# Patient Record
Sex: Female | Born: 1975
Health system: Southern US, Community
[De-identification: ages and names within clinical notes are randomized; demographics above are authoritative.]

## PROBLEM LIST (undated history)

## (undated) DIAGNOSIS — F419 Anxiety disorder, unspecified: Secondary | ICD-10-CM

## (undated) DIAGNOSIS — K219 Gastro-esophageal reflux disease without esophagitis: Secondary | ICD-10-CM

## (undated) DIAGNOSIS — Z789 Other specified health status: Secondary | ICD-10-CM

## (undated) DIAGNOSIS — Q909 Down syndrome, unspecified: Secondary | ICD-10-CM

## (undated) HISTORY — DX: Other specified health status: Z78.9

## (undated) HISTORY — PX: MOUTH SURGERY: SHX715

---

## 2014-07-15 DIAGNOSIS — R52 Pain, unspecified: Secondary | ICD-10-CM | POA: Diagnosis not present

## 2014-07-15 DIAGNOSIS — E039 Hypothyroidism, unspecified: Secondary | ICD-10-CM | POA: Diagnosis not present

## 2014-07-15 DIAGNOSIS — Z3041 Encounter for surveillance of contraceptive pills: Secondary | ICD-10-CM | POA: Diagnosis not present

## 2014-09-13 DIAGNOSIS — F419 Anxiety disorder, unspecified: Secondary | ICD-10-CM | POA: Diagnosis not present

## 2014-09-13 DIAGNOSIS — J329 Chronic sinusitis, unspecified: Secondary | ICD-10-CM | POA: Diagnosis not present

## 2014-09-13 DIAGNOSIS — E669 Obesity, unspecified: Secondary | ICD-10-CM | POA: Diagnosis not present

## 2014-09-13 DIAGNOSIS — Q909 Down syndrome, unspecified: Secondary | ICD-10-CM | POA: Diagnosis not present

## 2014-11-14 DIAGNOSIS — F419 Anxiety disorder, unspecified: Secondary | ICD-10-CM | POA: Diagnosis not present

## 2014-11-14 DIAGNOSIS — J329 Chronic sinusitis, unspecified: Secondary | ICD-10-CM | POA: Diagnosis not present

## 2014-11-14 DIAGNOSIS — E669 Obesity, unspecified: Secondary | ICD-10-CM | POA: Diagnosis not present

## 2014-11-14 DIAGNOSIS — Q909 Down syndrome, unspecified: Secondary | ICD-10-CM | POA: Diagnosis not present

## 2014-11-14 DIAGNOSIS — E039 Hypothyroidism, unspecified: Secondary | ICD-10-CM | POA: Diagnosis not present

## 2014-11-20 DIAGNOSIS — I10 Essential (primary) hypertension: Secondary | ICD-10-CM | POA: Diagnosis not present

## 2014-11-20 DIAGNOSIS — E039 Hypothyroidism, unspecified: Secondary | ICD-10-CM | POA: Diagnosis not present

## 2014-11-20 DIAGNOSIS — Z139 Encounter for screening, unspecified: Secondary | ICD-10-CM | POA: Diagnosis not present

## 2014-11-20 DIAGNOSIS — E780 Pure hypercholesterolemia: Secondary | ICD-10-CM | POA: Diagnosis not present

## 2015-02-11 DIAGNOSIS — B9689 Other specified bacterial agents as the cause of diseases classified elsewhere: Secondary | ICD-10-CM | POA: Diagnosis not present

## 2015-02-11 DIAGNOSIS — Z01419 Encounter for gynecological examination (general) (routine) without abnormal findings: Secondary | ICD-10-CM | POA: Diagnosis not present

## 2015-09-16 DIAGNOSIS — E119 Type 2 diabetes mellitus without complications: Secondary | ICD-10-CM | POA: Diagnosis not present

## 2015-09-16 DIAGNOSIS — I10 Essential (primary) hypertension: Secondary | ICD-10-CM | POA: Diagnosis not present

## 2015-09-16 DIAGNOSIS — J309 Allergic rhinitis, unspecified: Secondary | ICD-10-CM | POA: Diagnosis not present

## 2015-09-16 DIAGNOSIS — E7800 Pure hypercholesterolemia, unspecified: Secondary | ICD-10-CM | POA: Diagnosis not present

## 2015-09-16 DIAGNOSIS — Q909 Down syndrome, unspecified: Secondary | ICD-10-CM | POA: Diagnosis not present

## 2015-09-16 DIAGNOSIS — E039 Hypothyroidism, unspecified: Secondary | ICD-10-CM | POA: Diagnosis not present

## 2015-09-16 DIAGNOSIS — Z139 Encounter for screening, unspecified: Secondary | ICD-10-CM | POA: Diagnosis not present

## 2015-09-16 DIAGNOSIS — Z833 Family history of diabetes mellitus: Secondary | ICD-10-CM | POA: Diagnosis not present

## 2015-11-21 DIAGNOSIS — F4321 Adjustment disorder with depressed mood: Secondary | ICD-10-CM | POA: Diagnosis not present

## 2015-11-21 DIAGNOSIS — Q909 Down syndrome, unspecified: Secondary | ICD-10-CM | POA: Diagnosis not present

## 2015-11-21 DIAGNOSIS — R52 Pain, unspecified: Secondary | ICD-10-CM | POA: Diagnosis not present

## 2015-11-21 DIAGNOSIS — K219 Gastro-esophageal reflux disease without esophagitis: Secondary | ICD-10-CM | POA: Diagnosis not present

## 2015-11-21 DIAGNOSIS — E039 Hypothyroidism, unspecified: Secondary | ICD-10-CM | POA: Diagnosis not present

## 2015-12-08 DIAGNOSIS — Q909 Down syndrome, unspecified: Secondary | ICD-10-CM | POA: Diagnosis not present

## 2015-12-08 DIAGNOSIS — F900 Attention-deficit hyperactivity disorder, predominantly inattentive type: Secondary | ICD-10-CM | POA: Diagnosis not present

## 2015-12-23 DIAGNOSIS — Z3042 Encounter for surveillance of injectable contraceptive: Secondary | ICD-10-CM | POA: Diagnosis not present

## 2015-12-23 DIAGNOSIS — E039 Hypothyroidism, unspecified: Secondary | ICD-10-CM | POA: Diagnosis not present

## 2015-12-26 DIAGNOSIS — M545 Low back pain: Secondary | ICD-10-CM | POA: Diagnosis not present

## 2015-12-26 DIAGNOSIS — G2581 Restless legs syndrome: Secondary | ICD-10-CM | POA: Diagnosis not present

## 2015-12-26 DIAGNOSIS — F418 Other specified anxiety disorders: Secondary | ICD-10-CM | POA: Diagnosis not present

## 2015-12-26 DIAGNOSIS — R7303 Prediabetes: Secondary | ICD-10-CM | POA: Diagnosis not present

## 2015-12-26 DIAGNOSIS — Z1389 Encounter for screening for other disorder: Secondary | ICD-10-CM | POA: Diagnosis not present

## 2015-12-26 DIAGNOSIS — Z6841 Body Mass Index (BMI) 40.0 and over, adult: Secondary | ICD-10-CM | POA: Diagnosis not present

## 2015-12-26 DIAGNOSIS — E039 Hypothyroidism, unspecified: Secondary | ICD-10-CM | POA: Diagnosis not present

## 2016-03-02 DIAGNOSIS — F418 Other specified anxiety disorders: Secondary | ICD-10-CM | POA: Diagnosis not present

## 2016-03-02 DIAGNOSIS — K219 Gastro-esophageal reflux disease without esophagitis: Secondary | ICD-10-CM | POA: Diagnosis not present

## 2016-03-02 DIAGNOSIS — Z79899 Other long term (current) drug therapy: Secondary | ICD-10-CM | POA: Diagnosis not present

## 2016-03-02 DIAGNOSIS — E039 Hypothyroidism, unspecified: Secondary | ICD-10-CM | POA: Diagnosis not present

## 2016-03-02 DIAGNOSIS — G2581 Restless legs syndrome: Secondary | ICD-10-CM | POA: Diagnosis not present

## 2016-03-02 DIAGNOSIS — M545 Low back pain: Secondary | ICD-10-CM | POA: Diagnosis not present

## 2016-03-02 DIAGNOSIS — Z23 Encounter for immunization: Secondary | ICD-10-CM | POA: Diagnosis not present

## 2016-09-08 DIAGNOSIS — E039 Hypothyroidism, unspecified: Secondary | ICD-10-CM | POA: Diagnosis not present

## 2016-09-08 DIAGNOSIS — Z79899 Other long term (current) drug therapy: Secondary | ICD-10-CM | POA: Diagnosis not present

## 2016-09-08 DIAGNOSIS — Z0000 Encounter for general adult medical examination without abnormal findings: Secondary | ICD-10-CM | POA: Diagnosis not present

## 2016-09-08 DIAGNOSIS — Z136 Encounter for screening for cardiovascular disorders: Secondary | ICD-10-CM | POA: Diagnosis not present

## 2016-09-08 DIAGNOSIS — M545 Low back pain: Secondary | ICD-10-CM | POA: Diagnosis not present

## 2016-09-08 DIAGNOSIS — Z3009 Encounter for other general counseling and advice on contraception: Secondary | ICD-10-CM | POA: Diagnosis not present

## 2016-09-08 DIAGNOSIS — R7303 Prediabetes: Secondary | ICD-10-CM | POA: Diagnosis not present

## 2016-09-08 DIAGNOSIS — Q909 Down syndrome, unspecified: Secondary | ICD-10-CM | POA: Diagnosis not present

## 2016-09-08 DIAGNOSIS — F418 Other specified anxiety disorders: Secondary | ICD-10-CM | POA: Diagnosis not present

## 2016-09-08 DIAGNOSIS — Z6841 Body Mass Index (BMI) 40.0 and over, adult: Secondary | ICD-10-CM | POA: Diagnosis not present

## 2016-09-08 DIAGNOSIS — G2581 Restless legs syndrome: Secondary | ICD-10-CM | POA: Diagnosis not present

## 2016-09-08 DIAGNOSIS — Z309 Encounter for contraceptive management, unspecified: Secondary | ICD-10-CM | POA: Diagnosis not present

## 2016-09-13 DIAGNOSIS — Q909 Down syndrome, unspecified: Secondary | ICD-10-CM | POA: Diagnosis not present

## 2016-09-13 DIAGNOSIS — G2581 Restless legs syndrome: Secondary | ICD-10-CM | POA: Diagnosis not present

## 2016-09-13 DIAGNOSIS — F418 Other specified anxiety disorders: Secondary | ICD-10-CM | POA: Diagnosis not present

## 2016-09-13 DIAGNOSIS — E039 Hypothyroidism, unspecified: Secondary | ICD-10-CM | POA: Diagnosis not present

## 2016-09-13 DIAGNOSIS — Z6841 Body Mass Index (BMI) 40.0 and over, adult: Secondary | ICD-10-CM | POA: Diagnosis not present

## 2016-09-13 DIAGNOSIS — F70 Mild intellectual disabilities: Secondary | ICD-10-CM | POA: Diagnosis not present

## 2016-09-13 DIAGNOSIS — M545 Low back pain: Secondary | ICD-10-CM | POA: Diagnosis not present

## 2016-09-13 DIAGNOSIS — R7303 Prediabetes: Secondary | ICD-10-CM | POA: Diagnosis not present

## 2016-09-13 DIAGNOSIS — M1990 Unspecified osteoarthritis, unspecified site: Secondary | ICD-10-CM | POA: Diagnosis not present

## 2016-09-16 DIAGNOSIS — Q909 Down syndrome, unspecified: Secondary | ICD-10-CM | POA: Diagnosis not present

## 2016-09-16 DIAGNOSIS — M545 Low back pain: Secondary | ICD-10-CM | POA: Diagnosis not present

## 2016-09-16 DIAGNOSIS — F70 Mild intellectual disabilities: Secondary | ICD-10-CM | POA: Diagnosis not present

## 2016-09-16 DIAGNOSIS — F418 Other specified anxiety disorders: Secondary | ICD-10-CM | POA: Diagnosis not present

## 2016-09-16 DIAGNOSIS — M1990 Unspecified osteoarthritis, unspecified site: Secondary | ICD-10-CM | POA: Diagnosis not present

## 2016-09-23 DIAGNOSIS — M545 Low back pain: Secondary | ICD-10-CM | POA: Diagnosis not present

## 2016-09-23 DIAGNOSIS — F418 Other specified anxiety disorders: Secondary | ICD-10-CM | POA: Diagnosis not present

## 2016-09-23 DIAGNOSIS — F70 Mild intellectual disabilities: Secondary | ICD-10-CM | POA: Diagnosis not present

## 2016-09-23 DIAGNOSIS — Q909 Down syndrome, unspecified: Secondary | ICD-10-CM | POA: Diagnosis not present

## 2016-09-23 DIAGNOSIS — M1990 Unspecified osteoarthritis, unspecified site: Secondary | ICD-10-CM | POA: Diagnosis not present

## 2016-09-28 DIAGNOSIS — M545 Low back pain: Secondary | ICD-10-CM | POA: Diagnosis not present

## 2016-09-28 DIAGNOSIS — F70 Mild intellectual disabilities: Secondary | ICD-10-CM | POA: Diagnosis not present

## 2016-09-28 DIAGNOSIS — Q909 Down syndrome, unspecified: Secondary | ICD-10-CM | POA: Diagnosis not present

## 2016-09-28 DIAGNOSIS — M1990 Unspecified osteoarthritis, unspecified site: Secondary | ICD-10-CM | POA: Diagnosis not present

## 2016-09-28 DIAGNOSIS — F418 Other specified anxiety disorders: Secondary | ICD-10-CM | POA: Diagnosis not present

## 2016-09-30 DIAGNOSIS — M545 Low back pain: Secondary | ICD-10-CM | POA: Diagnosis not present

## 2016-09-30 DIAGNOSIS — F418 Other specified anxiety disorders: Secondary | ICD-10-CM | POA: Diagnosis not present

## 2016-09-30 DIAGNOSIS — M1990 Unspecified osteoarthritis, unspecified site: Secondary | ICD-10-CM | POA: Diagnosis not present

## 2016-09-30 DIAGNOSIS — Q909 Down syndrome, unspecified: Secondary | ICD-10-CM | POA: Diagnosis not present

## 2016-09-30 DIAGNOSIS — F70 Mild intellectual disabilities: Secondary | ICD-10-CM | POA: Diagnosis not present

## 2016-10-05 DIAGNOSIS — F70 Mild intellectual disabilities: Secondary | ICD-10-CM | POA: Diagnosis not present

## 2016-10-05 DIAGNOSIS — Q909 Down syndrome, unspecified: Secondary | ICD-10-CM | POA: Diagnosis not present

## 2016-10-05 DIAGNOSIS — M545 Low back pain: Secondary | ICD-10-CM | POA: Diagnosis not present

## 2016-10-05 DIAGNOSIS — M1990 Unspecified osteoarthritis, unspecified site: Secondary | ICD-10-CM | POA: Diagnosis not present

## 2016-10-05 DIAGNOSIS — F418 Other specified anxiety disorders: Secondary | ICD-10-CM | POA: Diagnosis not present

## 2016-10-07 DIAGNOSIS — M545 Low back pain: Secondary | ICD-10-CM | POA: Diagnosis not present

## 2016-10-07 DIAGNOSIS — F70 Mild intellectual disabilities: Secondary | ICD-10-CM | POA: Diagnosis not present

## 2016-10-07 DIAGNOSIS — F418 Other specified anxiety disorders: Secondary | ICD-10-CM | POA: Diagnosis not present

## 2016-10-07 DIAGNOSIS — Q909 Down syndrome, unspecified: Secondary | ICD-10-CM | POA: Diagnosis not present

## 2016-10-07 DIAGNOSIS — M1990 Unspecified osteoarthritis, unspecified site: Secondary | ICD-10-CM | POA: Diagnosis not present

## 2016-10-13 DIAGNOSIS — F70 Mild intellectual disabilities: Secondary | ICD-10-CM | POA: Diagnosis not present

## 2016-10-13 DIAGNOSIS — M545 Low back pain: Secondary | ICD-10-CM | POA: Diagnosis not present

## 2016-10-13 DIAGNOSIS — Q909 Down syndrome, unspecified: Secondary | ICD-10-CM | POA: Diagnosis not present

## 2016-10-13 DIAGNOSIS — M1990 Unspecified osteoarthritis, unspecified site: Secondary | ICD-10-CM | POA: Diagnosis not present

## 2016-10-13 DIAGNOSIS — F418 Other specified anxiety disorders: Secondary | ICD-10-CM | POA: Diagnosis not present

## 2016-10-21 DIAGNOSIS — F418 Other specified anxiety disorders: Secondary | ICD-10-CM | POA: Diagnosis not present

## 2016-10-21 DIAGNOSIS — M1990 Unspecified osteoarthritis, unspecified site: Secondary | ICD-10-CM | POA: Diagnosis not present

## 2016-10-21 DIAGNOSIS — M545 Low back pain: Secondary | ICD-10-CM | POA: Diagnosis not present

## 2016-10-21 DIAGNOSIS — F70 Mild intellectual disabilities: Secondary | ICD-10-CM | POA: Diagnosis not present

## 2016-10-21 DIAGNOSIS — Q909 Down syndrome, unspecified: Secondary | ICD-10-CM | POA: Diagnosis not present

## 2016-10-26 DIAGNOSIS — M545 Low back pain: Secondary | ICD-10-CM | POA: Diagnosis not present

## 2016-10-26 DIAGNOSIS — F70 Mild intellectual disabilities: Secondary | ICD-10-CM | POA: Diagnosis not present

## 2016-10-26 DIAGNOSIS — Q909 Down syndrome, unspecified: Secondary | ICD-10-CM | POA: Diagnosis not present

## 2016-10-26 DIAGNOSIS — F418 Other specified anxiety disorders: Secondary | ICD-10-CM | POA: Diagnosis not present

## 2016-10-26 DIAGNOSIS — M1990 Unspecified osteoarthritis, unspecified site: Secondary | ICD-10-CM | POA: Diagnosis not present

## 2016-11-11 DIAGNOSIS — F418 Other specified anxiety disorders: Secondary | ICD-10-CM | POA: Diagnosis not present

## 2016-11-11 DIAGNOSIS — M1990 Unspecified osteoarthritis, unspecified site: Secondary | ICD-10-CM | POA: Diagnosis not present

## 2016-11-11 DIAGNOSIS — M545 Low back pain: Secondary | ICD-10-CM | POA: Diagnosis not present

## 2016-11-11 DIAGNOSIS — Q909 Down syndrome, unspecified: Secondary | ICD-10-CM | POA: Diagnosis not present

## 2016-11-11 DIAGNOSIS — F70 Mild intellectual disabilities: Secondary | ICD-10-CM | POA: Diagnosis not present

## 2017-01-13 DIAGNOSIS — Z309 Encounter for contraceptive management, unspecified: Secondary | ICD-10-CM | POA: Diagnosis not present

## 2017-01-13 DIAGNOSIS — Z3009 Encounter for other general counseling and advice on contraception: Secondary | ICD-10-CM | POA: Diagnosis not present

## 2017-03-10 DIAGNOSIS — Z6841 Body Mass Index (BMI) 40.0 and over, adult: Secondary | ICD-10-CM | POA: Diagnosis not present

## 2017-03-10 DIAGNOSIS — Z1389 Encounter for screening for other disorder: Secondary | ICD-10-CM | POA: Diagnosis not present

## 2017-03-10 DIAGNOSIS — Q909 Down syndrome, unspecified: Secondary | ICD-10-CM | POA: Diagnosis not present

## 2017-03-10 DIAGNOSIS — M545 Low back pain: Secondary | ICD-10-CM | POA: Diagnosis not present

## 2017-03-10 DIAGNOSIS — J309 Allergic rhinitis, unspecified: Secondary | ICD-10-CM | POA: Diagnosis not present

## 2017-03-10 DIAGNOSIS — K219 Gastro-esophageal reflux disease without esophagitis: Secondary | ICD-10-CM | POA: Diagnosis not present

## 2017-03-10 DIAGNOSIS — F418 Other specified anxiety disorders: Secondary | ICD-10-CM | POA: Diagnosis not present

## 2017-03-10 DIAGNOSIS — Z79899 Other long term (current) drug therapy: Secondary | ICD-10-CM | POA: Diagnosis not present

## 2017-03-10 DIAGNOSIS — G2581 Restless legs syndrome: Secondary | ICD-10-CM | POA: Diagnosis not present

## 2017-03-10 DIAGNOSIS — E039 Hypothyroidism, unspecified: Secondary | ICD-10-CM | POA: Diagnosis not present

## 2017-03-10 DIAGNOSIS — R7303 Prediabetes: Secondary | ICD-10-CM | POA: Diagnosis not present

## 2017-03-10 DIAGNOSIS — Z23 Encounter for immunization: Secondary | ICD-10-CM | POA: Diagnosis not present

## 2017-04-06 DIAGNOSIS — Z3009 Encounter for other general counseling and advice on contraception: Secondary | ICD-10-CM | POA: Diagnosis not present

## 2017-04-06 DIAGNOSIS — Z309 Encounter for contraceptive management, unspecified: Secondary | ICD-10-CM | POA: Diagnosis not present

## 2017-06-28 DIAGNOSIS — Z3009 Encounter for other general counseling and advice on contraception: Secondary | ICD-10-CM | POA: Diagnosis not present

## 2017-06-28 DIAGNOSIS — Z309 Encounter for contraceptive management, unspecified: Secondary | ICD-10-CM | POA: Diagnosis not present

## 2017-08-26 DIAGNOSIS — Z79899 Other long term (current) drug therapy: Secondary | ICD-10-CM | POA: Diagnosis not present

## 2017-08-26 DIAGNOSIS — S32048A Other fracture of fourth lumbar vertebra, initial encounter for closed fracture: Secondary | ICD-10-CM | POA: Diagnosis not present

## 2017-08-26 DIAGNOSIS — S22089A Unspecified fracture of T11-T12 vertebra, initial encounter for closed fracture: Secondary | ICD-10-CM | POA: Diagnosis not present

## 2017-08-26 DIAGNOSIS — S32038A Other fracture of third lumbar vertebra, initial encounter for closed fracture: Secondary | ICD-10-CM | POA: Diagnosis not present

## 2017-08-26 DIAGNOSIS — R079 Chest pain, unspecified: Secondary | ICD-10-CM | POA: Diagnosis not present

## 2017-08-26 DIAGNOSIS — S22088A Other fracture of T11-T12 vertebra, initial encounter for closed fracture: Secondary | ICD-10-CM | POA: Diagnosis not present

## 2017-08-26 DIAGNOSIS — K219 Gastro-esophageal reflux disease without esophagitis: Secondary | ICD-10-CM | POA: Diagnosis not present

## 2017-08-26 DIAGNOSIS — F419 Anxiety disorder, unspecified: Secondary | ICD-10-CM | POA: Diagnosis not present

## 2017-08-26 DIAGNOSIS — M542 Cervicalgia: Secondary | ICD-10-CM | POA: Diagnosis not present

## 2017-08-26 DIAGNOSIS — G8911 Acute pain due to trauma: Secondary | ICD-10-CM | POA: Diagnosis not present

## 2017-08-26 DIAGNOSIS — S32028A Other fracture of second lumbar vertebra, initial encounter for closed fracture: Secondary | ICD-10-CM | POA: Diagnosis not present

## 2017-08-26 DIAGNOSIS — S32049A Unspecified fracture of fourth lumbar vertebra, initial encounter for closed fracture: Secondary | ICD-10-CM | POA: Diagnosis not present

## 2017-08-26 DIAGNOSIS — S32018A Other fracture of first lumbar vertebra, initial encounter for closed fracture: Secondary | ICD-10-CM | POA: Diagnosis not present

## 2017-08-27 DIAGNOSIS — Z041 Encounter for examination and observation following transport accident: Secondary | ICD-10-CM | POA: Diagnosis not present

## 2017-08-27 DIAGNOSIS — R079 Chest pain, unspecified: Secondary | ICD-10-CM | POA: Diagnosis not present

## 2017-09-06 DIAGNOSIS — M546 Pain in thoracic spine: Secondary | ICD-10-CM | POA: Diagnosis not present

## 2017-09-06 DIAGNOSIS — Z6841 Body Mass Index (BMI) 40.0 and over, adult: Secondary | ICD-10-CM | POA: Diagnosis not present

## 2017-09-06 DIAGNOSIS — M545 Low back pain: Secondary | ICD-10-CM | POA: Diagnosis not present

## 2017-09-06 DIAGNOSIS — T07XXXA Unspecified multiple injuries, initial encounter: Secondary | ICD-10-CM | POA: Diagnosis not present

## 2017-09-22 DIAGNOSIS — Z309 Encounter for contraceptive management, unspecified: Secondary | ICD-10-CM | POA: Diagnosis not present

## 2017-09-22 DIAGNOSIS — E785 Hyperlipidemia, unspecified: Secondary | ICD-10-CM | POA: Diagnosis not present

## 2017-09-22 DIAGNOSIS — E669 Obesity, unspecified: Secondary | ICD-10-CM | POA: Diagnosis not present

## 2017-09-22 DIAGNOSIS — Z136 Encounter for screening for cardiovascular disorders: Secondary | ICD-10-CM | POA: Diagnosis not present

## 2017-09-22 DIAGNOSIS — Z3009 Encounter for other general counseling and advice on contraception: Secondary | ICD-10-CM | POA: Diagnosis not present

## 2017-09-22 DIAGNOSIS — Z0000 Encounter for general adult medical examination without abnormal findings: Secondary | ICD-10-CM | POA: Diagnosis not present

## 2017-10-06 DIAGNOSIS — S32009A Unspecified fracture of unspecified lumbar vertebra, initial encounter for closed fracture: Secondary | ICD-10-CM | POA: Diagnosis not present

## 2017-10-06 DIAGNOSIS — Z6841 Body Mass Index (BMI) 40.0 and over, adult: Secondary | ICD-10-CM | POA: Diagnosis not present

## 2017-10-06 DIAGNOSIS — Z79899 Other long term (current) drug therapy: Secondary | ICD-10-CM | POA: Diagnosis not present

## 2017-10-06 DIAGNOSIS — S22009A Unspecified fracture of unspecified thoracic vertebra, initial encounter for closed fracture: Secondary | ICD-10-CM | POA: Diagnosis not present

## 2017-10-07 DIAGNOSIS — M5134 Other intervertebral disc degeneration, thoracic region: Secondary | ICD-10-CM | POA: Diagnosis not present

## 2017-10-07 DIAGNOSIS — S32009A Unspecified fracture of unspecified lumbar vertebra, initial encounter for closed fracture: Secondary | ICD-10-CM | POA: Diagnosis not present

## 2017-10-07 DIAGNOSIS — M5137 Other intervertebral disc degeneration, lumbosacral region: Secondary | ICD-10-CM | POA: Diagnosis not present

## 2017-10-07 DIAGNOSIS — M47817 Spondylosis without myelopathy or radiculopathy, lumbosacral region: Secondary | ICD-10-CM | POA: Diagnosis not present

## 2017-10-07 DIAGNOSIS — M4687 Other specified inflammatory spondylopathies, lumbosacral region: Secondary | ICD-10-CM | POA: Diagnosis not present

## 2017-11-07 DIAGNOSIS — E039 Hypothyroidism, unspecified: Secondary | ICD-10-CM | POA: Diagnosis not present

## 2017-11-07 DIAGNOSIS — S22009A Unspecified fracture of unspecified thoracic vertebra, initial encounter for closed fracture: Secondary | ICD-10-CM | POA: Diagnosis not present

## 2017-11-07 DIAGNOSIS — M545 Low back pain: Secondary | ICD-10-CM | POA: Diagnosis not present

## 2017-11-07 DIAGNOSIS — S32009A Unspecified fracture of unspecified lumbar vertebra, initial encounter for closed fracture: Secondary | ICD-10-CM | POA: Diagnosis not present

## 2017-12-08 DIAGNOSIS — Z309 Encounter for contraceptive management, unspecified: Secondary | ICD-10-CM | POA: Diagnosis not present

## 2017-12-08 DIAGNOSIS — Z3009 Encounter for other general counseling and advice on contraception: Secondary | ICD-10-CM | POA: Diagnosis not present

## 2018-03-17 DIAGNOSIS — Z309 Encounter for contraceptive management, unspecified: Secondary | ICD-10-CM | POA: Diagnosis not present

## 2018-06-28 DIAGNOSIS — Z309 Encounter for contraceptive management, unspecified: Secondary | ICD-10-CM | POA: Diagnosis not present

## 2018-06-28 DIAGNOSIS — M545 Low back pain: Secondary | ICD-10-CM | POA: Diagnosis not present

## 2018-06-28 DIAGNOSIS — Z3009 Encounter for other general counseling and advice on contraception: Secondary | ICD-10-CM | POA: Diagnosis not present

## 2018-06-28 DIAGNOSIS — G2581 Restless legs syndrome: Secondary | ICD-10-CM | POA: Diagnosis not present

## 2018-06-28 DIAGNOSIS — Q909 Down syndrome, unspecified: Secondary | ICD-10-CM | POA: Diagnosis not present

## 2018-06-28 DIAGNOSIS — F418 Other specified anxiety disorders: Secondary | ICD-10-CM | POA: Diagnosis not present

## 2018-09-25 DIAGNOSIS — Z1231 Encounter for screening mammogram for malignant neoplasm of breast: Secondary | ICD-10-CM | POA: Diagnosis not present

## 2018-09-25 DIAGNOSIS — Z6841 Body Mass Index (BMI) 40.0 and over, adult: Secondary | ICD-10-CM | POA: Diagnosis not present

## 2018-09-25 DIAGNOSIS — Z9181 History of falling: Secondary | ICD-10-CM | POA: Diagnosis not present

## 2018-09-25 DIAGNOSIS — Z1331 Encounter for screening for depression: Secondary | ICD-10-CM | POA: Diagnosis not present

## 2018-09-25 DIAGNOSIS — Z0000 Encounter for general adult medical examination without abnormal findings: Secondary | ICD-10-CM | POA: Diagnosis not present

## 2018-12-28 DIAGNOSIS — Z01419 Encounter for gynecological examination (general) (routine) without abnormal findings: Secondary | ICD-10-CM | POA: Diagnosis not present

## 2018-12-28 DIAGNOSIS — Z124 Encounter for screening for malignant neoplasm of cervix: Secondary | ICD-10-CM | POA: Diagnosis not present

## 2018-12-28 DIAGNOSIS — Z309 Encounter for contraceptive management, unspecified: Secondary | ICD-10-CM | POA: Diagnosis not present

## 2018-12-28 DIAGNOSIS — Z3009 Encounter for other general counseling and advice on contraception: Secondary | ICD-10-CM | POA: Diagnosis not present

## 2019-07-20 DIAGNOSIS — Z309 Encounter for contraceptive management, unspecified: Secondary | ICD-10-CM | POA: Diagnosis not present

## 2019-10-18 DIAGNOSIS — Z309 Encounter for contraceptive management, unspecified: Secondary | ICD-10-CM | POA: Diagnosis not present

## 2019-12-02 ENCOUNTER — Emergency Department (HOSPITAL_COMMUNITY)
Admission: EM | Admit: 2019-12-02 | Discharge: 2019-12-03 | Disposition: A | Discharge status: Home / Self Care | Payer: Medicare Other | Attending: Emergency Medicine | Admitting: Emergency Medicine

## 2019-12-02 ENCOUNTER — Other Ambulatory Visit: Payer: Self-pay

## 2019-12-02 ENCOUNTER — Emergency Department (HOSPITAL_COMMUNITY): Payer: Medicare Other

## 2019-12-02 DIAGNOSIS — R0689 Other abnormalities of breathing: Secondary | ICD-10-CM | POA: Diagnosis not present

## 2019-12-02 DIAGNOSIS — N939 Abnormal uterine and vaginal bleeding, unspecified: Secondary | ICD-10-CM | POA: Diagnosis not present

## 2019-12-02 DIAGNOSIS — R52 Pain, unspecified: Secondary | ICD-10-CM | POA: Diagnosis not present

## 2019-12-02 DIAGNOSIS — I959 Hypotension, unspecified: Secondary | ICD-10-CM | POA: Diagnosis not present

## 2019-12-02 DIAGNOSIS — R0602 Shortness of breath: Secondary | ICD-10-CM | POA: Diagnosis not present

## 2019-12-02 DIAGNOSIS — R102 Pelvic and perineal pain: Secondary | ICD-10-CM | POA: Insufficient documentation

## 2019-12-02 DIAGNOSIS — M5489 Other dorsalgia: Secondary | ICD-10-CM | POA: Diagnosis not present

## 2019-12-02 DIAGNOSIS — R000 Tachycardia, unspecified: Secondary | ICD-10-CM | POA: Insufficient documentation

## 2019-12-02 DIAGNOSIS — Z20822 Contact with and (suspected) exposure to covid-19: Secondary | ICD-10-CM | POA: Insufficient documentation

## 2019-12-02 DIAGNOSIS — D259 Leiomyoma of uterus, unspecified: Secondary | ICD-10-CM | POA: Diagnosis not present

## 2019-12-02 DIAGNOSIS — R1084 Generalized abdominal pain: Secondary | ICD-10-CM | POA: Diagnosis not present

## 2019-12-02 DIAGNOSIS — R58 Hemorrhage, not elsewhere classified: Secondary | ICD-10-CM | POA: Diagnosis not present

## 2019-12-02 LAB — COMPREHENSIVE METABOLIC PANEL
ALT: 16 U/L (ref 0–44)
AST: 16 U/L (ref 15–41)
Albumin: 3.8 g/dL (ref 3.5–5.0)
Alkaline Phosphatase: 46 U/L (ref 38–126)
Anion gap: 12 (ref 5–15)
BUN: 11 mg/dL (ref 6–20)
CO2: 19 mmol/L — ABNORMAL LOW (ref 22–32)
Calcium: 9.1 mg/dL (ref 8.9–10.3)
Chloride: 107 mmol/L (ref 98–111)
Creatinine, Ser: 1.05 mg/dL — ABNORMAL HIGH (ref 0.44–1.00)
GFR calc Af Amer: >60 mL/min (ref >60)
GFR calc non Af Amer: >60 mL/min (ref >60)
Glucose, Bld: 129 mg/dL — ABNORMAL HIGH (ref 70–99)
Potassium: 4.3 mmol/L (ref 3.5–5.1)
Sodium: 138 mmol/L (ref 135–145)
Total Bilirubin: 1 mg/dL (ref 0.3–1.2)
Total Protein: 6.5 g/dL (ref 6.5–8.1)

## 2019-12-02 LAB — CBC WITH DIFFERENTIAL/PLATELET
Abs Immature Granulocytes: 0.14 10*3/uL — ABNORMAL HIGH (ref 0.00–0.07)
Basophils Absolute: 0.1 10*3/uL (ref 0.0–0.1)
Basophils Relative: 0 %
Eosinophils Absolute: 0 10*3/uL (ref 0.0–0.5)
Eosinophils Relative: 0 %
HCT: 36.8 % (ref 36.0–46.0)
Hemoglobin: 11.9 g/dL — ABNORMAL LOW (ref 12.0–15.0)
Immature Granulocytes: 1 %
Lymphocytes Relative: 8 %
Lymphs Abs: 1.1 10*3/uL (ref 0.7–4.0)
MCH: 29.1 pg (ref 26.0–34.0)
MCHC: 32.3 g/dL (ref 30.0–36.0)
MCV: 90 fL (ref 80.0–100.0)
Monocytes Absolute: 0.6 10*3/uL (ref 0.1–1.0)
Monocytes Relative: 4 %
Neutro Abs: 11.6 10*3/uL — ABNORMAL HIGH (ref 1.7–7.7)
Neutrophils Relative %: 87 %
Platelets: 260 10*3/uL (ref 150–400)
RBC: 4.09 MIL/uL (ref 3.87–5.11)
RDW: 13.7 % (ref 11.5–15.5)
WBC: 13.5 10*3/uL — ABNORMAL HIGH (ref 4.0–10.5)
nRBC: 0 % (ref 0.0–0.2)

## 2019-12-02 LAB — TYPE AND SCREEN
ABO/RH(D): A NEG
Antibody Screen: NEGATIVE

## 2019-12-02 LAB — I-STAT BETA HCG BLOOD, ED (MC, WL, AP ONLY): I-stat hCG, quantitative: <5 m[IU]/mL (ref <5)

## 2019-12-02 LAB — PROTIME-INR
INR: 1.2 (ref 0.8–1.2)
Prothrombin Time: 14.5 seconds (ref 11.4–15.2)

## 2019-12-02 LAB — CBG MONITORING, ED: Glucose-Capillary: 126 mg/dL — ABNORMAL HIGH (ref 70–99)

## 2019-12-02 LAB — HEMOGLOBIN AND HEMATOCRIT, BLOOD
HCT: 33.9 % — ABNORMAL LOW (ref 36.0–46.0)
Hemoglobin: 10.9 g/dL — ABNORMAL LOW (ref 12.0–15.0)

## 2019-12-02 LAB — ABO/RH: ABO/RH(D): A NEG

## 2019-12-02 LAB — SARS CORONAVIRUS 2 BY RT PCR (HOSPITAL ORDER, PERFORMED IN ~~LOC~~ HOSPITAL LAB): SARS Coronavirus 2: NEGATIVE

## 2019-12-02 MED ORDER — ESTROGENS CONJUGATED 25 MG IJ SOLR
25.0000 mg | Freq: Once | INTRAMUSCULAR | Status: AC
  Administered 2019-12-02: 25 mg via INTRAVENOUS
  Filled 2019-12-02: qty 25

## 2019-12-02 MED ORDER — STERILE WATER FOR INJECTION IJ SOLN
INTRAMUSCULAR | Status: AC
  Administered 2019-12-02: 10 mL
  Filled 2019-12-02: qty 10

## 2019-12-02 MED ORDER — ONDANSETRON HCL 4 MG/2ML IJ SOLN
4.0000 mg | Freq: Once | INTRAMUSCULAR | Status: AC
  Administered 2019-12-02: 4 mg via INTRAVENOUS
  Filled 2019-12-02: qty 2

## 2019-12-02 MED ORDER — ACETAMINOPHEN 500 MG PO TABS
1000.0000 mg | ORAL_TABLET | Freq: Once | ORAL | Status: AC
  Administered 2019-12-02: 1000 mg via ORAL
  Filled 2019-12-02: qty 2

## 2019-12-02 MED ORDER — SODIUM CHLORIDE 0.9 % IV BOLUS
1000.0000 mL | Freq: Once | INTRAVENOUS | Status: AC
  Administered 2019-12-02: 1000 mL via INTRAVENOUS

## 2019-12-02 NOTE — ED Triage Notes (Signed)
Pt from home bib Vanessa Cooper for vaginal bleeding that started today. EMS reports blood was all throughout the house.

## 2019-12-02 NOTE — ED Notes (Signed)
Patient transported to [facility] 

## 2019-12-02 NOTE — ED Provider Notes (Signed)
Emergency Department Provider Note   I have reviewed the triage vital signs and the nursing notes.   HISTORY  Chief Complaint Vaginal Bleeding   HPI Vanessa Cooper is a 44 y.o. female presents to the emergency department from home via EMS with heavy vaginal bleeding starting today.  EMS report large-volume blood throughout the house and in the bathtub where they ultimately found the patient.  She was tachycardic and hypotensive with them and they were unable to establish IV access in route.  They got variable blood pressure throughout the transport.  They report the patient being diaphoretic and feeling lightheaded.  EMS does report history of some neurocognitive issues and the parents are in route to the ED. Patient tells me that she is sexually active and has had intercourse in the past week which was not painful and no immediate bleeding.  She reports receiving the Depo shot regularly for contraception.   No past medical history on file.  There are no problems to display for this patient.  Allergies Patient has no known allergies.  No family history on file.  Social History Social History   Tobacco Use  . Smoking status: Not on file  Substance Use Topics  . Alcohol use: Not on file  . Drug use: Not on file    Review of Systems  Constitutional: No fever/chills. Positive generalized weakness with diaphoresis.  Eyes: No visual changes. ENT: No sore throat. Cardiovascular: Denies chest pain. Respiratory: Positive shortness of breath. Gastrointestinal: Positive diffuse abdominal pain.  No nausea, no vomiting.  No diarrhea.  No constipation. Genitourinary: Heavy vaginal bleeding starting today.  Musculoskeletal: Negative for back pain. Skin: Negative for rash. Neurological: Negative for headaches, focal weakness or numbness.  10-point ROS otherwise negative.  ____________________________________________   PHYSICAL EXAM:  VITAL SIGNS: ED Triage Vitals  Enc Vitals  Group     BP 12/02/19 1731 (!) 140/108     Pulse Rate 12/02/19 1731 (!) 119     Resp 12/02/19 1731 (!) 25     Temp 12/02/19 1735 98.7 F (37.1 C)     Temp Source 12/02/19 1731 Oral     SpO2 12/02/19 1731 100 %   Constitutional: Alert and oriented. Well appearing and in no acute distress. Eyes: Conjunctivae are normal.  Head: Atraumatic. Nose: No congestion/rhinnorhea. Mouth/Throat: Mucous membranes are moist.  Neck: No stridor.   Cardiovascular: Tachycardia. Good peripheral circulation. Grossly normal heart sounds.   Respiratory: Normal respiratory effort.  No retractions. Lungs CTAB. Gastrointestinal: Soft with mild diffuse tenderness. No distention.  Gyn: Patient with clot and BRB noted externally. Large clot burden removed and cervix visualized with mild oozing blood from os. No visible lacerations or external cervical bleeding.  Musculoskeletal: No gross deformities of extremities. Neurologic:  Normal speech and language. No gross focal neurologic deficits are appreciated.  Skin:  Skin is warm, dry and intact. No rash noted.  ____________________________________________   LABS (all labs ordered are listed, but only abnormal results are displayed)  Labs Reviewed  COMPREHENSIVE METABOLIC PANEL - Abnormal; Notable for the following components:      Result Value   CO2 19 (*)    Glucose, Bld 129 (*)    Creatinine, Ser 1.05 (*)    All other components within normal limits  CBC WITH DIFFERENTIAL/PLATELET - Abnormal; Notable for the following components:   WBC 13.5 (*)    Hemoglobin 11.9 (*)    Neutro Abs 11.6 (*)    Abs Immature Granulocytes 0.14 (*)  All other components within normal limits  HEMOGLOBIN AND HEMATOCRIT, BLOOD - Abnormal; Notable for the following components:   Hemoglobin 10.9 (*)    HCT 33.9 (*)    All other components within normal limits  CBG MONITORING, ED - Abnormal; Notable for the following components:   Glucose-Capillary 126 (*)    All other  components within normal limits  SARS CORONAVIRUS 2 BY RT PCR (HOSPITAL ORDER, [place] [facility])  PROTIME-INR  I-STAT BETA HCG BLOOD, ED ([facility], [facility], [facility] ONLY)  TYPE AND SCREEN  ABO/RH   ____________________________________________  EKG   EKG Interpretation  Date/Time:  Sunday December 02 2019 17:32:45 EDT Ventricular Rate:  121 PR Interval:    QRS Duration: 73 QT Interval:  335 QTC Calculation: 476 R Axis:   60 Text Interpretation: Sinus tachycardia Probable left atrial enlargement Borderline T abnormalities, anterior leads No STEMI Confirmed by Nanda Quinton [phone]) on 12/02/2019 5:44:01 PM       ____________________________________________  RADIOLOGY  US PELVIC COMPLETE WITH TRANSVAGINAL  Result Date: 12/02/2019 CLINICAL DATA:  44 year old female with pelvic pain and vaginal bleeding. EXAM: TRANSABDOMINAL AND TRANSVAGINAL ULTRASOUND OF PELVIS TECHNIQUE: Both transabdominal and transvaginal ultrasound examinations of the pelvis were performed. Transabdominal technique was performed for global imaging of the pelvis including uterus, ovaries, adnexal regions, and pelvic cul-de-sac. It was necessary to proceed with endovaginal exam following the transabdominal exam to visualize the adnexal regions and endometrium. COMPARISON:  None FINDINGS: Uterus Measurements: 8.8 x 3.2 x 5.2 cm = volume: 73 mL. A 3 x 2 x 3.1 cm LEFT uterine body/fundal fibroid is noted abutting the endometrial stripe. Endometrium Thickness: 2 mm.  No focal abnormality visualized. Right ovary Not visualized. Left ovary Not visualized Other findings No adnexal mass or abnormal free fluid. IMPRESSION: 1. 3.1 cm LEFT uterine body/fundal fibroid abutting the endometrial stripe. 2. Normal endometrial stripe thickness. 3. Ovaries not visualized.  No free fluid or adnexal mass. Electronically Signed   By: Margarette Canada M.D.   On: 12/02/2019 20:17     ____________________________________________   PROCEDURES  Procedure(s) performed:   Procedures  CRITICAL CARE Performed by: Margette Fast Total critical care time: 40 minutes Critical care time was exclusive of separately billable procedures and treating other patients. Critical care was necessary to treat or prevent imminent or life-threatening deterioration. Critical care was time spent personally by me on the following activities: development of treatment plan with patient and/or surrogate as well as nursing, discussions with consultants, evaluation of patient's response to treatment, examination of patient, obtaining history from patient or surrogate, ordering and performing treatments and interventions, ordering and review of laboratory studies, ordering and review of radiographic studies, pulse oximetry and re-evaluation of patient's condition.  Nanda Quinton, MD Emergency Medicine    Emergency Ultrasound Study:   Angiocath insertion Performed by: Margette Fast  Consent: Verbal consent obtained. Risks and benefits: risks, benefits and alternatives were discussed Immediately prior to procedure the correct patient, procedure, equipment, support staff and site/side marked as needed.  Indication: difficult IV access Preparation: Patient was prepped and draped in the usual sterile fashion. Vein Location: Left AC vein visualized during assessment for potential access sites and was found to be patent/ easily compressed with linear ultrasound.  The needle was visualized with real-time ultrasound and guided into the vein. Gauge: 18  Image saved and stored.  Normal blood return.  Patient tolerance: Patient tolerated the procedure well with no immediate complications.  ____________________________________________   INITIAL IMPRESSION / ASSESSMENT AND PLAN / ED COURSE  Pertinent labs & imaging results that were available during my care of the patient were reviewed by  me and considered in my medical decision making (see chart for details).   Patient presents to the emergency department for evaluation of heavy vaginal bleeding starting today.  She had some low blood pressures with EMS in route but no IV access.  On arrival patient is awake and alert with vaginal bleeding noted externally.  IV access established and will start IV fluids.  Report of heavy vaginal bleeding and blood loss on scene.  Parents in route to assist consents for various procedures and/or PRBC transfusion but initial vitals here not requiring emergency blood transfusion.  I-STAT beta-hCG sent to lab and will follow closely.   10:21 PM  Spoke with OB Dr. Ilda Basset regarding the repeat H/H. Plan for additional obs time in the ED with a 3rd Hb at midnight and second dose of Estrogen IV. Patient with continued vaginal ooze but bleeding is decreased. Care transferred to Dr. Leonette Monarch pending additional Hb and OB f/u.  ____________________________________________  FINAL CLINICAL IMPRESSION(S) / ED DIAGNOSES  Final diagnoses:  Vaginal bleeding  Pelvic pain    MEDICATIONS GIVEN DURING THIS VISIT:  Medications  sodium chloride 0.9 % bolus 1,000 mL (0 mLs Intravenous Stopped 12/02/19 1851)  conjugated estrogens (PREMARIN) injection 25 mg (25 mg Intravenous Given 12/02/19 1852)  ondansetron (ZOFRAN) injection 4 mg (4 mg Intravenous Given 12/02/19 2058)    Note:  This document was prepared using Dragon voice recognition software and may include unintentional dictation errors.  Nanda Quinton, MD, [facility] Emergency Medicine    Dyon Rotert, Wonda Olds, MD 12/05/19 [phone]

## 2019-12-03 DIAGNOSIS — N939 Abnormal uterine and vaginal bleeding, unspecified: Secondary | ICD-10-CM | POA: Diagnosis not present

## 2019-12-03 LAB — HEMOGLOBIN AND HEMATOCRIT, BLOOD
HCT: 33.1 % — ABNORMAL LOW (ref 36.0–46.0)
Hemoglobin: 10.7 g/dL — ABNORMAL LOW (ref 12.0–15.0)

## 2019-12-03 MED ORDER — NORGESTIMATE-ETH ESTRADIOL 0.25-35 MG-MCG PO TABS: 1.0000 | ORAL_TABLET | Freq: Every day | ORAL | 0 refills | Status: DC

## 2019-12-03 NOTE — ED Notes (Signed)
Patient verbalizes understanding of discharge instructions. Opportunity for questioning and answers were provided. Armband removed by staff, pt discharged from ED stable & ambulatory with family

## 2019-12-03 NOTE — ED Provider Notes (Signed)
I assumed care of this patient.  Please see previous provider note for further details of Hx, PE.  Briefly patient is a 44 y.o. female who presented abnormal vaginal bleeding with decreasing Hb. Given 2 doses of estrogen per Gyn. Plan to repeat Hb and reassess.   Hb stabilized. She is feeling better. Spoke with Dr. Ilda Basset, who recommnded Rx of [place] and Gyn f/u.  The patient appears reasonably screened and/or stabilized for discharge and I doubt any other medical condition or other [facility] requiring further screening, evaluation, or treatment in the ED at this time prior to discharge. Safe for discharge with strict return precautions.  Disposition: Discharge  Condition: Good  I have discussed the results, Dx and Tx plan with the patient/family who expressed understanding and agree(s) with the plan. Discharge instructions discussed at length. The patient/family was given strict return precautions who verbalized understanding of the instructions. No further questions at time of discharge.    ED Discharge Orders         Ordered    norgestimate-ethinyl estradiol ([place] 28) 0.25-35 MG-MCG tablet  Daily     Discontinue  Reprint     12/03/19 0104             Follow Up: [facility] [place] [address] [phone] Call  to schedule follow up in 1-2 weeks          Gwendlyn Hanback, Grayce Sessions, MD 12/03/19 [phone]

## 2019-12-27 ENCOUNTER — Encounter: Payer: Medicare Other | Admitting: Obstetrics and Gynecology

## 2020-01-10 ENCOUNTER — Ambulatory Visit (INDEPENDENT_AMBULATORY_CARE_PROVIDER_SITE_OTHER): Payer: Medicare Other | Admitting: Obstetrics and Gynecology

## 2020-01-10 ENCOUNTER — Other Ambulatory Visit: Payer: Self-pay

## 2020-01-10 ENCOUNTER — Encounter: Payer: Self-pay | Admitting: Obstetrics and Gynecology

## 2020-01-10 ENCOUNTER — Other Ambulatory Visit (HOSPITAL_COMMUNITY)
Admission: RE | Admit: 2020-01-10 | Discharge: 2020-01-10 | Disposition: A | Discharge status: Home / Self Care | Payer: Medicare Other | Source: Ambulatory Visit | Attending: Obstetrics and Gynecology | Admitting: Obstetrics and Gynecology

## 2020-01-10 VITALS — BP 138/97 | HR 80 | Wt 270.0 lb

## 2020-01-10 DIAGNOSIS — N921 Excessive and frequent menstruation with irregular cycle: Secondary | ICD-10-CM | POA: Diagnosis not present

## 2020-01-10 NOTE — Progress Notes (Signed)
Obstetrics & Gynecology Office Visit   Chief Complaint  Patient presents with  . Vaginal Bleeding    ER follow up, vaginal bleeding w/ cramping and clots    History of Present Illness: 44 y.o. G64P1001 female who presents in follow up from the ED on 7/11-7/04/2020 for heavy vaginal bleeding. She was treated with IV conjugated equine estrogen x 2 (25 mg) and was discharged on Sprintec.  She had a pelvic ultrasound notable for a 3.1 cm fibroid on the left in the uterine body/fundal areal abutting the endometrial stripe.  Endometrial stripe thickness was 2 mm.  Her ovaries were not visualized.   She had another episode of heavy vaginal bleeding that lasted for about 6-7 days that ended 3 days ago.  She states that she passed clots.    She takes Depo Provera and is due for her next shot next week. She has been on this medication since 2004.  She has always had issues with her cycle. She has not had a cycle with this medication until recently.  An IUD has been tried. She states that she is not good at taking pills and doesn't want to try them.  Her last pap smear was in 12/28/2018 at [facility].  She states that the last time she had intercourse was about 2 years ago.    Past Medical History:  Diagnosis Date  . No known health problems     Past Surgical History:  Procedure Laterality Date  . CESAREAN SECTION    . MOUTH SURGERY      Gynecologic History: Patient's last menstrual period was 01/01/2020.  Obstetric History: G1P1001, s/p c-section x 1  Family History  Problem Relation Age of Onset  . Diabetes Paternal Grandmother   . Hypertension Father   . Breast cancer Neg Hx   . Ovarian cancer Neg Hx   . Uterine cancer Neg Hx     Social History   Socioeconomic History  . Marital status: Legally Separated    Spouse name: Not on file  . Number of children: Not on file  . Years of education: Not on file  . Highest education level: Not on file  Occupational  History  . Not on file  Tobacco Use  . Smoking status: Never Smoker  . Smokeless tobacco: Never Used  Vaping Use  . Vaping Use: Never used  Substance and Sexual Activity  . Alcohol use: Never  . Drug use: Never  . Sexual activity: Yes    Partners: Male    Birth control/protection: Injection  Other Topics Concern  . Not on file  Social History Narrative  . Not on file   Social Determinants of Health   Financial Resource Strain:   . Difficulty of Paying Living Expenses: Not on file  Food Insecurity:   . Worried About Charity fundraiser in the Last Year: Not on file  . Ran Out of Food in the Last Year: Not on file  Transportation Needs:   . Lack of Transportation (Medical): Not on file  . Lack of Transportation (Non-Medical): Not on file  Physical Activity:   . Days of Exercise per Week: Not on file  . Minutes of Exercise per Session: Not on file  Stress:   . Feeling of Stress : Not on file  Social Connections:   . Frequency of Communication with Friends and Family: Not on file  . Frequency of Social Gatherings with Friends and Family: Not on file  . Attends  Religious Services: Not on file  . Active Member of Clubs or Organizations: Not on file  . Attends Archivist Meetings: Not on file  . Marital Status: Not on file  Intimate Partner Violence:   . Fear of Current or Ex-Partner: Not on file  . Emotionally Abused: Not on file  . Physically Abused: Not on file  . Sexually Abused: Not on file    No Known Allergies  Prior to Admission medications   Medication Sig Start Date End Date Taking? Authorizing Provider  gabapentin (NEURONTIN) 300 MG capsule Take 300 mg by mouth at bedtime.    [provider]  ibuprofen (ADVIL) 800 MG tablet Take 800 mg by mouth 3 (three) times daily as needed for mild pain.  11/11/19   [provider]  norgestimate-ethinyl estradiol ([place] 28) 0.25-35 MG-MCG tablet Take 1 tablet by mouth daily. 12/03/19 01/02/20   Fatima Blank, MD    Review of Systems  Constitutional: Negative.   HENT: Negative.   Eyes: Negative.   Respiratory: Negative.   Cardiovascular: Negative.   Gastrointestinal: Negative.   Genitourinary: Negative.   Musculoskeletal: Negative.   Skin: Negative.   Neurological: Negative.   Psychiatric/Behavioral: Negative.      Physical Exam BP (!) 138/97   Pulse 80   Wt 270 lb (122.5 kg)   LMP 01/01/2020   BMI 51.02 kg/m  Patient's last menstrual period was 01/01/2020. Physical Exam Constitutional:      General: She is not in acute distress.    Appearance: Normal appearance. She is well-developed.  Genitourinary:     Pelvic exam was performed with patient in the lithotomy position.     Vulva, urethra, bladder and vagina normal.     Cervical exam comments: Cervix not well visualized due to patient intolerance to exam.  Bimanual limited due to patient intolerance to exam. .   HENT:     Head: Normocephalic and atraumatic.  Eyes:     General: No scleral icterus.    Conjunctiva/sclera: Conjunctivae normal.  Cardiovascular:     Rate and Rhythm: Normal rate and regular rhythm.     Heart sounds: No murmur heard.  No friction rub. No gallop.   Pulmonary:     Effort: Pulmonary effort is normal. No respiratory distress.     Breath sounds: Normal breath sounds. No wheezing or rales.  Abdominal:     General: Bowel sounds are normal. There is no distension.     Palpations: Abdomen is soft. There is no mass.     Tenderness: There is no abdominal tenderness. There is no guarding or rebound.  Musculoskeletal:        General: Normal range of motion.     Cervical back: Normal range of motion and neck supple.  Neurological:     General: No focal deficit present.     Mental Status: She is alert and oriented to person, place, and time.     Cranial Nerves: No cranial nerve deficit.  Skin:    General: Skin is warm and dry.     Findings: No erythema.  Psychiatric:        Mood  and Affect: Mood normal.        Behavior: Behavior normal.        Judgment: Judgment normal.     Female chaperone present for pelvic and breast  portions of the physical exam  Assessment: 44 y.o. G77P1001 female here for  1. Menorrhagia with irregular cycle  Plan: Problem List Items Addressed This Visit    None    Visit Diagnoses    Menorrhagia with irregular cycle    -  Primary   Relevant Orders   Cervicovaginal ancillary only     Discussed management options for abnormal uterine bleeding including expectant, NSAIDs, tranexamic acid (Lysteda), oral progesterone (Provera, norethindrone, megace), Depo Provera, Levonorgestrel containing IUD, endometrial ablation (Novasure) or hysterectomy as definitive surgical management.  Discussed risks and benefits of each method.   Final management decision will hinge on results of patient's work up and whether an underlying etiology for the patients bleeding symptoms can be discerned.  The workup has been accomplished apart from an endometrial biopsy. However, due to not being able to do any form of pelvic exam and her thin endometrial stripe, her risk of malignancy is low.  After discussion of options, she would like to proceed with hysterectomy.  We discussed hysterectomy was discussed in detail. She would like to proceed.   A total of 40 minutes were spent face-to-face with the patient as well as preparation, review, communication, and documentation during this encounter.    Prentice Docker, MD 01/11/2020 9:33 PM

## 2020-01-11 ENCOUNTER — Encounter: Payer: Self-pay | Admitting: Obstetrics and Gynecology

## 2020-01-14 LAB — CERVICOVAGINAL ANCILLARY ONLY
Chlamydia: NEGATIVE
Comment: NEGATIVE
Comment: NEGATIVE
Comment: NORMAL
Neisseria Gonorrhea: NEGATIVE
Trichomonas: NEGATIVE

## 2020-01-15 DIAGNOSIS — Z309 Encounter for contraceptive management, unspecified: Secondary | ICD-10-CM | POA: Diagnosis not present

## 2020-01-18 ENCOUNTER — Telehealth: Payer: Self-pay | Admitting: Obstetrics and Gynecology

## 2020-01-18 NOTE — Telephone Encounter (Signed)
LM on sister, Eldridge Dace' VM for pt to rtn call

## 2020-01-30 ENCOUNTER — Telehealth: Payer: Self-pay | Admitting: Obstetrics and Gynecology

## 2020-01-30 NOTE — Telephone Encounter (Signed)
01/29/20 rec'd VM from sister Helene Kelp, calling me back  Today I called her back and left VM

## 2020-02-07 ENCOUNTER — Telehealth: Payer: Self-pay | Admitting: Obstetrics and Gynecology

## 2020-02-07 NOTE — Telephone Encounter (Signed)
Patient's sister, Eldridge Dace rtn'd call to schedule her sister, Tristin, for TLH/BS, cystoscopy w Kendra Opitz 10/28  H&P 10/13 @ 2:10   Covid testing 10/27 @ 8-10:30, [address], drive up and wear mask. Advised pt to quarantine until DOS.  Pre-admit phone call appointment to be requested - date and time will be included on H&P paper work. Explained that this appointment has a call window. Based on the time scheduled will indicate if the call will be received within a 4 hour window before 1:00 or after.  Advised that pt may also receive calls from the hospital pharmacy and pre-service center.  Confirmed pt has Medicaid and Medicare.

## 2020-02-07 NOTE — Telephone Encounter (Signed)
-----   Message from Will Bonnet, MD sent at 01/11/2020  9:38 PM EDT ----- Regarding: Schedule surgery Surgery Booking Request Patient Full Name:  Vanessa Cooper  MRN: [id number]  DOB: 1975-06-24  Surgeon: Prentice Docker, MD  Requested Surgery Date and Time: TBD Primary Diagnosis AND Code: Menorrhagia with irregular cycle [N92.1] Secondary Diagnosis and Code:  Surgical Procedure: Total Laparoscopic hysterectomy,  bilateral salpingectomy, cystoscopy RNFA Requested?: No L&D Notification: No Admission Status: same day surgery Length of Surgery: 125 min Special Case Needs: No H&P: Yes Phone Interview???:  Yes Interpreter: No Medical Clearance:  No Special Scheduling Instructions: No Any known health/anesthesia issues, diabetes, sleep apnea, latex allergy, defibrillator/pacemaker?: No Acuity: P3   (P1 highest, P2 delay may cause harm, P3 low, elective gyn, P4 lowest)

## 2020-02-13 ENCOUNTER — Telehealth: Payer: Self-pay | Admitting: Obstetrics and Gynecology

## 2020-02-13 NOTE — Telephone Encounter (Signed)
LM for Vanessa Cooper, sister. I adv that I have all of the dates for her sister's upcoming surgery and that she can call me at her convenience.

## 2020-02-26 ENCOUNTER — Telehealth: Payer: Self-pay | Admitting: Obstetrics and Gynecology

## 2020-02-26 NOTE — Telephone Encounter (Signed)
Pt sister rtn'd my cal and I was able to give her the dates/times for her sister's upcoming appts. Adv her that H&P is 10/13 @ 2:10, PAT phone 10/22 between 8a-1p, Covid 10/27 between 8-10am  DOS 10/28

## 2020-03-05 ENCOUNTER — Other Ambulatory Visit: Payer: Self-pay

## 2020-03-05 ENCOUNTER — Ambulatory Visit (INDEPENDENT_AMBULATORY_CARE_PROVIDER_SITE_OTHER): Payer: Medicare Other | Admitting: Obstetrics and Gynecology

## 2020-03-05 ENCOUNTER — Encounter: Payer: Self-pay | Admitting: Obstetrics and Gynecology

## 2020-03-05 VITALS — BP 126/84 | Ht <= 58 in

## 2020-03-05 DIAGNOSIS — N921 Excessive and frequent menstruation with irregular cycle: Secondary | ICD-10-CM

## 2020-03-05 NOTE — Progress Notes (Signed)
Preoperative History and Physical  Vanessa Cooper is a 44 y.o. G1P1001 here for surgical management of menorrhagia with irregular cycel.   No significant preoperative concerns.  History of Present Illness: 44 y.o. G56P1001 female who presents for preoperative visit.  She was last seen in follow up in August from the ED on 7/11-7/04/2020 for heavy vaginal bleeding. She was treated with IV conjugated equine estrogen x 2 (25 mg) and was discharged on Sprintec.  She had a pelvic ultrasound notable for a 3.1 cm fibroid on the left in the uterine body/fundal areal abutting the endometrial stripe.  Endometrial stripe thickness was 2 mm.  Her ovaries were not visualized.   She had another episode of heavy vaginal bleeding that lasted for about 6-7 days that ended 3 days ago.  She states that she passed clots.    She takes Depo Provera and is due for her next shot next week. She has been on this medication since 2004.  She has always had issues with her cycle. She has not had a cycle with this medication until recently.  An IUD has been tried. She states that she is not good at taking pills and doesn't want to try them.  Her last pap smear was in 12/28/2018 at [facility].  She states that the last time she had intercourse was about 2 years ago.   The workup has been accomplished apart from an endometrial biopsy. However, due to not being able to do any form of pelvic exam and her thin endometrial stripe, her risk of malignancy is low.  After discussion of options, she would like to proceed with hysterectomy.  We discussed hysterectomy in detail.  After re-visiting the issue, we will change the surgery to to the one listed below.   Proposed surgery: Hysteroscopy, dilation and curettage, endometrial ablation  Past Medical History:  Diagnosis Date  . No known health problems    Past Surgical History:  Procedure Laterality Date  . CESAREAN SECTION    . MOUTH SURGERY     OB History    Gravida Para Term Preterm AB Living  1 1 1     1   SAB TAB Ectopic Multiple Live Births          1    # Outcome Date GA Lbr Len/2nd Weight Sex Delivery Anes PTL Lv  1 Term 01/02/03    Jerilynn Mages CS-LTranv   LIV  Patient denies any other pertinent gynecologic issues.   Current Outpatient Medications on File Prior to Visit  Medication Sig Dispense Refill  . gabapentin (NEURONTIN) 300 MG capsule Take 300 mg by mouth at bedtime.    Marland Kitchen ibuprofen (ADVIL) 800 MG tablet Take 800 mg by mouth 3 (three) times daily as needed for mild pain.     . medroxyPROGESTERone (DEPO-PROVERA) 150 MG/ML injection Inject 150 mg into the muscle every 3 (three) months.     No current facility-administered medications on file prior to visit.   No Known Allergies  Social History:   reports that she has never smoked. She has never used smokeless tobacco. She reports that she does not drink alcohol and does not use drugs.  Family History  Problem Relation Age of Onset  . Diabetes Paternal Grandmother   . Hypertension Father   . Breast cancer Neg Hx   . Ovarian cancer Neg Hx   . Uterine cancer Neg Hx     Review of Systems: Noncontributory  PHYSICAL EXAM: BP 126/84   Ht  4\' 10"  (1.473 m)   BMI 56.43 kg/m   CONSTITUTIONAL: Well-developed, well-nourished female in no acute distress.  HENT:  Normocephalic, atraumatic, External right and left ear normal. Oropharynx is clear and moist EYES: Conjunctivae and EOM are normal. Pupils are equal, round, and reactive to light. No scleral icterus.  NECK: Normal range of motion, supple, no masses SKIN: Skin is warm and dry. No rash noted. Not diaphoretic. No erythema. No pallor. [place]: Alert and oriented to person, place, and time. Normal reflexes, muscle tone coordination. No cranial nerve deficit noted. PSYCHIATRIC: Normal mood and affect. Normal behavior. Normal judgment and thought content. CARDIOVASCULAR: Normal heart rate noted, regular rhythm RESPIRATORY: Effort and  breath sounds normal, no problems with respiration noted ABDOMEN: Soft, nontender, nondistended. PELVIC: Deferred MUSCULOSKELETAL: Normal range of motion. No edema and no tenderness. 2+ distal pulses.  Labs: STD screen negative  Imaging Studies: See u/s from 12/02/2019 report  Assessment:   ICD-10-CM   1. Menorrhagia with irregular cycle  N92.1     Plan: Patient will undergo surgical management with above surery.   The risks of surgery were discussed in detail with the patient including but not limited to: bleeding which may require transfusion or reoperation; infection which may require antibiotics; injury to surrounding organs which may involve bowel, bladder, ureters ; need for additional procedures including laparoscopy or laparotomy; thromboembolic phenomenon, surgical site problems and other postoperative/anesthesia complications. Likelihood of success in alleviating the patient's condition was discussed. Routine postoperative instructions will be reviewed with the patient and her family in detail after surgery.  The patient concurred with the proposed plan, giving informed written consent for the surgery.  Preoperative prophylactic antibiotics, as indicated, and SCDs ordered on call to the OR.    Prentice Docker, MD 03/05/2020 2:49 PM

## 2020-03-05 NOTE — H&P (View-Only) (Signed)
Preoperative History and Physical  Vanessa Cooper is a 44 y.o. G1P1001 here for surgical management of menorrhagia with irregular cycel.   No significant preoperative concerns.  History of Present Illness: 44 y.o. G36P1001 female who presents for preoperative visit.  She was last seen in follow up in August from the ED on 7/11-7/04/2020 for heavy vaginal bleeding. She was treated with IV conjugated equine estrogen x 2 (25 mg) and was discharged on Sprintec.  She had a pelvic ultrasound notable for a 3.1 cm fibroid on the left in the uterine body/fundal areal abutting the endometrial stripe.  Endometrial stripe thickness was 2 mm.  Her ovaries were not visualized.   She had another episode of heavy vaginal bleeding that lasted for about 6-7 days that ended 3 days ago.  She states that she passed clots.    She takes Depo Provera and is due for her next shot next week. She has been on this medication since 2004.  She has always had issues with her cycle. She has not had a cycle with this medication until recently.  An IUD has been tried. She states that she is not good at taking pills and doesn't want to try them.  Her last pap smear was in 12/28/2018 at [facility].  She states that the last time she had intercourse was about 2 years ago.   The workup has been accomplished apart from an endometrial biopsy. However, due to not being able to do any form of pelvic exam and her thin endometrial stripe, her risk of malignancy is low.  After discussion of options, she would like to proceed with hysterectomy.  We discussed hysterectomy in detail.  After re-visiting the issue, we will change the surgery to to the one listed below.   Proposed surgery: Hysteroscopy, dilation and curettage, endometrial ablation  Past Medical History:  Diagnosis Date  . No known health problems    Past Surgical History:  Procedure Laterality Date  . CESAREAN SECTION    . MOUTH SURGERY     OB History    Gravida Para Term Preterm AB Living  1 1 1     1   SAB TAB Ectopic Multiple Live Births          1    # Outcome Date GA Lbr Len/2nd Weight Sex Delivery Anes PTL Lv  1 Term 01/02/03    Jerilynn Mages CS-LTranv   LIV  Patient denies any other pertinent gynecologic issues.   Current Outpatient Medications on File Prior to Visit  Medication Sig Dispense Refill  . gabapentin (NEURONTIN) 300 MG capsule Take 300 mg by mouth at bedtime.    Marland Kitchen ibuprofen (ADVIL) 800 MG tablet Take 800 mg by mouth 3 (three) times daily as needed for mild pain.     . medroxyPROGESTERone (DEPO-PROVERA) 150 MG/ML injection Inject 150 mg into the muscle every 3 (three) months.     No current facility-administered medications on file prior to visit.   No Known Allergies  Social History:   reports that she has never smoked. She has never used smokeless tobacco. She reports that she does not drink alcohol and does not use drugs.  Family History  Problem Relation Age of Onset  . Diabetes Paternal Grandmother   . Hypertension Father   . Breast cancer Neg Hx   . Ovarian cancer Neg Hx   . Uterine cancer Neg Hx     Review of Systems: Noncontributory  PHYSICAL EXAM: BP 126/84   Ht  4\' 10"  (1.473 m)   BMI 56.43 kg/m   CONSTITUTIONAL: Well-developed, well-nourished female in no acute distress.  HENT:  Normocephalic, atraumatic, External right and left ear normal. Oropharynx is clear and moist EYES: Conjunctivae and EOM are normal. Pupils are equal, round, and reactive to light. No scleral icterus.  NECK: Normal range of motion, supple, no masses SKIN: Skin is warm and dry. No rash noted. Not diaphoretic. No erythema. No pallor. [place]: Alert and oriented to person, place, and time. Normal reflexes, muscle tone coordination. No cranial nerve deficit noted. PSYCHIATRIC: Normal mood and affect. Normal behavior. Normal judgment and thought content. CARDIOVASCULAR: Normal heart rate noted, regular rhythm RESPIRATORY: Effort and  breath sounds normal, no problems with respiration noted ABDOMEN: Soft, nontender, nondistended. PELVIC: Deferred MUSCULOSKELETAL: Normal range of motion. No edema and no tenderness. 2+ distal pulses.  Labs: STD screen negative  Imaging Studies: See u/s from 12/02/2019 report  Assessment:   ICD-10-CM   1. Menorrhagia with irregular cycle  N92.1     Plan: Patient will undergo surgical management with above surery.   The risks of surgery were discussed in detail with the patient including but not limited to: bleeding which may require transfusion or reoperation; infection which may require antibiotics; injury to surrounding organs which may involve bowel, bladder, ureters ; need for additional procedures including laparoscopy or laparotomy; thromboembolic phenomenon, surgical site problems and other postoperative/anesthesia complications. Likelihood of success in alleviating the patient's condition was discussed. Routine postoperative instructions will be reviewed with the patient and her family in detail after surgery.  The patient concurred with the proposed plan, giving informed written consent for the surgery.  Preoperative prophylactic antibiotics, as indicated, and SCDs ordered on call to the OR.    Prentice Docker, MD 03/05/2020 2:49 PM

## 2020-03-14 ENCOUNTER — Encounter
Admission: RE | Admit: 2020-03-14 | Discharge: 2020-03-14 | Disposition: A | Discharge status: Home / Self Care | Payer: Medicare Other | Source: Ambulatory Visit | Attending: Obstetrics and Gynecology | Admitting: Obstetrics and Gynecology

## 2020-03-14 ENCOUNTER — Other Ambulatory Visit: Payer: Self-pay

## 2020-03-14 HISTORY — DX: Gastro-esophageal reflux disease without esophagitis: K21.9

## 2020-03-14 HISTORY — DX: Anxiety disorder, unspecified: F41.9

## 2020-03-14 HISTORY — DX: Down syndrome, unspecified: Q90.9

## 2020-03-14 NOTE — Patient Instructions (Signed)
Your procedure is scheduled on: March 20, 2020 THURSDAY Report to Day Surgery on the 2nd floor of the [facility]. To find out your arrival time, please call [phone] between 1PM - 3PM on: March 19, 2020 Wednesday   REMEMBER: Instructions that are not followed completely may result in serious medical risk, up to and including death; or upon the discretion of your surgeon and anesthesiologist your surgery may need to be rescheduled.  Do not eat food after midnight the night before surgery.  No gum chewing, lozengers or hard candies.  You may however, drink CLEAR liquids up to 2 hours before you are scheduled to arrive for your surgery. Do not drink anything within 2 hours of your scheduled arrival time.  Clear liquids include: - water  - apple juice without pulp - gatorade (not RED, PURPLE, OR BLUE) - black coffee or tea (Do NOT add milk or creamers to the coffee or tea) Do NOT drink anything that is not on this list.  Type 1 and Type 2 diabetics should only drink water.  In addition, your doctor has ordered for you to drink the provided  Ensure Pre-Surgery Clear Carbohydrate Drink  Drinking this carbohydrate drink up to two hours before surgery helps to reduce insulin resistance and improve patient outcomes. Please complete drinking 2 hours prior to scheduled arrival time.  TAKE THESE MEDICATIONS THE MORNING OF SURGERY WITH A SIP OF WATER: MAY TAKE ANXIETY MED IF NEEDED   One week prior to surgery: Stop Anti-inflammatories (NSAIDS) such as Advil, Aleve, Ibuprofen, Motrin, Naproxen, Naprosyn and ASPIRIN OR Aspirin based products such as Excedrin, Goodys Powder, BC Powder. Stop ANY OVER THE COUNTER supplements until after surgery. (You may continue taking Tylenol, Vitamin D, Vitamin B, and multivitamin.)  No Alcohol for 24 hours before or after surgery.  No Smoking including e-cigarettes for 24 hours prior to surgery.  No chewable tobacco products for at least 6 hours  prior to surgery.  No nicotine patches on the day of surgery.  Do not use any "recreational" drugs for at least a week prior to your surgery.  Please be advised that the combination of cocaine and anesthesia may have negative outcomes, up to and including death. If you test positive for cocaine, your surgery will be cancelled.  On the morning of surgery brush your teeth with toothpaste and water, you may rinse your mouth with mouthwash if you wish. Do not swallow any toothpaste or mouthwash.  Do not wear jewelry, make-up, hairpins, clips or nail polish.  Do not wear lotions, powders, or perfumes.   Do not shave 48 hours prior to surgery.   Contact lenses, hearing aids and dentures may not be worn into surgery.  Do not bring valuables to the hospital. [facility] is not responsible for any missing/lost belongings or valuables.   SHOWER THE MORNING OF SURGERY   Notify your doctor if there is any change in your medical condition (cold, fever, infection).  Wear comfortable clothing (specific to your surgery type) to the hospital.  Plan for stool softeners for home use; pain medications have a tendency to cause constipation. You can also help prevent constipation by eating foods high in fiber such as fruits and vegetables and drinking plenty of fluids as your diet allows.  After surgery, you can help prevent lung complications by doing breathing exercises.  Take deep breaths and cough every 1-2 hours. Your doctor may order a device called an Incentive Spirometer to help you take deep  breaths. When coughing or sneezing, hold a pillow firmly against your incision with both hands. This is called splinting. Doing this helps protect your incision. It also decreases belly discomfort.  If you are being discharged the day of surgery, you will not be allowed to drive home. You will need a responsible adult (18 years or older) to drive you home and stay with you that night.    Please call the  [facility] at [phone] if you have any questions about these instructions.  Visitation Policy:  Patients undergoing a surgery or procedure may have one family member or support person with them as long as that person is not COVID-19 positive or experiencing its symptoms.  That person may remain in the waiting area during the procedure.  Inpatient Visitation Update:   In an effort to ensure the safety of our team members and our patients, we are implementing a change to our visitation policy:  Effective Monday, Aug. 9, at 7 a.m., inpatients will be allowed one support person.  o The support person may change daily.  o The support person must pass our screening, gel in and out, and wear a mask at all times, including in the patients room.  o Patients must also wear a mask when staff or their support person are in the room.  o Masking is required regardless of vaccination status.  Systemwide, no visitors 17 or younger.

## 2020-03-17 ENCOUNTER — Other Ambulatory Visit: Payer: Self-pay | Admitting: Obstetrics and Gynecology

## 2020-03-17 DIAGNOSIS — F430 Acute stress reaction: Secondary | ICD-10-CM

## 2020-03-17 DIAGNOSIS — F411 Generalized anxiety disorder: Secondary | ICD-10-CM

## 2020-03-17 MED ORDER — ALPRAZOLAM 1 MG PO TABS: 1.0000 mg | ORAL_TABLET | ORAL | 0 refills | Status: DC

## 2020-03-19 ENCOUNTER — Other Ambulatory Visit
Admission: RE | Admit: 2020-03-19 | Discharge: 2020-03-19 | Disposition: A | Discharge status: Home / Self Care | Payer: Medicare Other | Source: Ambulatory Visit | Attending: Obstetrics and Gynecology | Admitting: Obstetrics and Gynecology

## 2020-03-19 ENCOUNTER — Other Ambulatory Visit: Payer: Self-pay

## 2020-03-19 DIAGNOSIS — Z20822 Contact with and (suspected) exposure to covid-19: Secondary | ICD-10-CM | POA: Insufficient documentation

## 2020-03-19 DIAGNOSIS — Z01812 Encounter for preprocedural laboratory examination: Secondary | ICD-10-CM | POA: Diagnosis not present

## 2020-03-19 LAB — CBC
HCT: 44.8 % (ref 36.0–46.0)
Hemoglobin: 14.5 g/dL (ref 12.0–15.0)
MCH: 26.7 pg (ref 26.0–34.0)
MCHC: 32.4 g/dL (ref 30.0–36.0)
MCV: 82.4 fL (ref 80.0–100.0)
Platelets: 261 10*3/uL (ref 150–400)
RBC: 5.44 MIL/uL — ABNORMAL HIGH (ref 3.87–5.11)
RDW: 16.1 % — ABNORMAL HIGH (ref 11.5–15.5)
WBC: 11.1 10*3/uL — ABNORMAL HIGH (ref 4.0–10.5)
nRBC: 0 % (ref 0.0–0.2)

## 2020-03-19 LAB — SARS CORONAVIRUS 2 (TAT 6-24 HRS): SARS Coronavirus 2: NEGATIVE

## 2020-03-19 LAB — TYPE AND SCREEN
ABO/RH(D): A NEG
Antibody Screen: NEGATIVE

## 2020-03-20 ENCOUNTER — Ambulatory Visit
Admission: RE | Admit: 2020-03-20 | Discharge: 2020-03-20 | Disposition: A | Discharge status: Home / Self Care | Payer: Medicare Other | Attending: Obstetrics and Gynecology | Admitting: Obstetrics and Gynecology

## 2020-03-20 ENCOUNTER — Other Ambulatory Visit: Payer: Self-pay | Admitting: Obstetrics and Gynecology

## 2020-03-20 ENCOUNTER — Other Ambulatory Visit: Payer: Self-pay

## 2020-03-20 ENCOUNTER — Ambulatory Visit: Payer: Medicare Other | Admitting: Urgent Care

## 2020-03-20 ENCOUNTER — Ambulatory Visit: Payer: Medicare Other | Admitting: Certified Registered"

## 2020-03-20 ENCOUNTER — Encounter: Payer: Self-pay | Admitting: Obstetrics and Gynecology

## 2020-03-20 ENCOUNTER — Encounter
Admission: RE | Disposition: A | Discharge status: Home / Self Care | Payer: Self-pay | Source: Home / Self Care | Attending: Obstetrics and Gynecology

## 2020-03-20 DIAGNOSIS — G4730 Sleep apnea, unspecified: Secondary | ICD-10-CM | POA: Diagnosis not present

## 2020-03-20 DIAGNOSIS — F419 Anxiety disorder, unspecified: Secondary | ICD-10-CM | POA: Diagnosis not present

## 2020-03-20 DIAGNOSIS — N921 Excessive and frequent menstruation with irregular cycle: Secondary | ICD-10-CM | POA: Diagnosis not present

## 2020-03-20 DIAGNOSIS — B372 Candidiasis of skin and nail: Secondary | ICD-10-CM

## 2020-03-20 DIAGNOSIS — Q909 Down syndrome, unspecified: Secondary | ICD-10-CM | POA: Insufficient documentation

## 2020-03-20 DIAGNOSIS — K219 Gastro-esophageal reflux disease without esophagitis: Secondary | ICD-10-CM | POA: Diagnosis not present

## 2020-03-20 DIAGNOSIS — Z793 Long term (current) use of hormonal contraceptives: Secondary | ICD-10-CM | POA: Insufficient documentation

## 2020-03-20 HISTORY — PX: DILITATION & CURRETTAGE/HYSTROSCOPY WITH NOVASURE ABLATION: SHX5568

## 2020-03-20 LAB — GLUCOSE, CAPILLARY: Glucose-Capillary: 103 mg/dL — ABNORMAL HIGH (ref 70–99)

## 2020-03-20 LAB — POCT PREGNANCY, URINE: Preg Test, Ur: NEGATIVE

## 2020-03-20 SURGERY — DILATATION & CURETTAGE/HYSTEROSCOPY WITH NOVASURE ABLATION
Anesthesia: General

## 2020-03-20 MED ORDER — OXYCODONE HCL 5 MG/5ML PO SOLN: 5.0000 mg | Freq: Once | ORAL | Status: AC | PRN

## 2020-03-20 MED ORDER — FLUCONAZOLE 150 MG PO TABS: 150.0000 mg | ORAL_TABLET | Freq: Once | ORAL | 0 refills | Status: AC

## 2020-03-20 MED ORDER — SILVER NITRATE-POT NITRATE 75-25 % EX MISC
CUTANEOUS | Status: DC | PRN
  Administered 2020-03-20: 1

## 2020-03-20 MED ORDER — SUCCINYLCHOLINE CHLORIDE 20 MG/ML IJ SOLN
INTRAMUSCULAR | Status: DC | PRN
  Administered 2020-03-20: 140 mg via INTRAVENOUS

## 2020-03-20 MED ORDER — OXYCODONE HCL 5 MG PO TABS
ORAL_TABLET | ORAL | Status: AC
  Administered 2020-03-20: 5 mg via ORAL
  Filled 2020-03-20: qty 1

## 2020-03-20 MED ORDER — FAMOTIDINE 20 MG PO TABS: 20.0000 mg | ORAL_TABLET | Freq: Once | ORAL | Status: AC

## 2020-03-20 MED ORDER — FENTANYL CITRATE (PF) 100 MCG/2ML IJ SOLN
INTRAMUSCULAR | Status: AC
  Administered 2020-03-20: 50 ug via INTRAVENOUS
  Filled 2020-03-20: qty 2

## 2020-03-20 MED ORDER — ONDANSETRON HCL 4 MG/2ML IJ SOLN: 4.0000 mg | Freq: Once | INTRAMUSCULAR | Status: DC | PRN

## 2020-03-20 MED ORDER — ROCURONIUM BROMIDE 100 MG/10ML IV SOLN
INTRAVENOUS | Status: DC | PRN
  Administered 2020-03-20 (×2): 10 mg via INTRAVENOUS
  Administered 2020-03-20: 20 mg via INTRAVENOUS

## 2020-03-20 MED ORDER — HYDROCODONE-ACETAMINOPHEN 5-325 MG PO TABS: 1.0000 | ORAL_TABLET | Freq: Three times a day (TID) | ORAL | 0 refills | Status: AC | PRN

## 2020-03-20 MED ORDER — OXYCODONE HCL 5 MG PO TABS: 5.0000 mg | ORAL_TABLET | Freq: Once | ORAL | Status: AC

## 2020-03-20 MED ORDER — LIDOCAINE HCL (PF) 2 % IJ SOLN
INTRAMUSCULAR | Status: AC
  Filled 2020-03-20: qty 5

## 2020-03-20 MED ORDER — ACETAMINOPHEN 10 MG/ML IV SOLN
INTRAVENOUS | Status: AC
  Filled 2020-03-20: qty 100

## 2020-03-20 MED ORDER — ONDANSETRON HCL 4 MG/2ML IJ SOLN
INTRAMUSCULAR | Status: DC | PRN
  Administered 2020-03-20: 4 mg via INTRAVENOUS

## 2020-03-20 MED ORDER — POVIDONE-IODINE 10 % EX SWAB: 2.0000 "application " | Freq: Once | CUTANEOUS | Status: DC

## 2020-03-20 MED ORDER — KETOROLAC TROMETHAMINE 30 MG/ML IJ SOLN
INTRAMUSCULAR | Status: DC | PRN
  Administered 2020-03-20: 30 mg via INTRAVENOUS

## 2020-03-20 MED ORDER — KETOROLAC TROMETHAMINE 30 MG/ML IJ SOLN
INTRAMUSCULAR | Status: AC
  Filled 2020-03-20: qty 1

## 2020-03-20 MED ORDER — LACTATED RINGERS IV SOLN: INTRAVENOUS | Status: DC

## 2020-03-20 MED ORDER — ORAL CARE MOUTH RINSE: 15.0000 mL | Freq: Once | OROMUCOSAL | Status: AC

## 2020-03-20 MED ORDER — ONDANSETRON HCL 4 MG/2ML IJ SOLN
INTRAMUSCULAR | Status: AC
  Filled 2020-03-20: qty 2

## 2020-03-20 MED ORDER — FENTANYL CITRATE (PF) 100 MCG/2ML IJ SOLN: 25.0000 ug | INTRAMUSCULAR | Status: DC | PRN

## 2020-03-20 MED ORDER — PROPOFOL 10 MG/ML IV BOLUS
INTRAVENOUS | Status: AC
  Filled 2020-03-20: qty 20

## 2020-03-20 MED ORDER — DEXMEDETOMIDINE (PRECEDEX) IN NS 20 MCG/5ML (4 MCG/ML) IV SYRINGE
PREFILLED_SYRINGE | INTRAVENOUS | Status: AC
  Filled 2020-03-20: qty 5

## 2020-03-20 MED ORDER — PROPOFOL 10 MG/ML IV BOLUS
INTRAVENOUS | Status: DC | PRN
  Administered 2020-03-20: 150 mg via INTRAVENOUS

## 2020-03-20 MED ORDER — DEXAMETHASONE SODIUM PHOSPHATE 10 MG/ML IJ SOLN
INTRAMUSCULAR | Status: AC
  Filled 2020-03-20: qty 1

## 2020-03-20 MED ORDER — LIDOCAINE HCL (CARDIAC) PF 100 MG/5ML IV SOSY
PREFILLED_SYRINGE | INTRAVENOUS | Status: DC | PRN
  Administered 2020-03-20: 100 mg via INTRAVENOUS

## 2020-03-20 MED ORDER — FAMOTIDINE 20 MG PO TABS
ORAL_TABLET | ORAL | Status: AC
  Administered 2020-03-20: 20 mg via ORAL
  Filled 2020-03-20: qty 1

## 2020-03-20 MED ORDER — PROPOFOL 500 MG/50ML IV EMUL
INTRAVENOUS | Status: AC
  Filled 2020-03-20: qty 50

## 2020-03-20 MED ORDER — SUGAMMADEX SODIUM 500 MG/5ML IV SOLN
INTRAVENOUS | Status: DC | PRN
  Administered 2020-03-20: 250 mg via INTRAVENOUS

## 2020-03-20 MED ORDER — CHLORHEXIDINE GLUCONATE 0.12 % MT SOLN: 15.0000 mL | Freq: Once | OROMUCOSAL | Status: AC

## 2020-03-20 MED ORDER — NYSTATIN 100000 UNIT/GM EX CREA: 1.0000 "application " | TOPICAL_CREAM | Freq: Two times a day (BID) | CUTANEOUS | 1 refills | Status: AC

## 2020-03-20 MED ORDER — DEXAMETHASONE SODIUM PHOSPHATE 10 MG/ML IJ SOLN
INTRAMUSCULAR | Status: DC | PRN
  Administered 2020-03-20: 10 mg via INTRAVENOUS

## 2020-03-20 MED ORDER — IBUPROFEN 600 MG PO TABS: 600.0000 mg | ORAL_TABLET | Freq: Three times a day (TID) | ORAL | 0 refills | Status: AC | PRN

## 2020-03-20 MED ORDER — ACETAMINOPHEN 10 MG/ML IV SOLN
INTRAVENOUS | Status: DC | PRN
  Administered 2020-03-20: 1000 mg via INTRAVENOUS

## 2020-03-20 MED ORDER — OXYCODONE HCL 5 MG PO TABS: 5.0000 mg | ORAL_TABLET | Freq: Once | ORAL | Status: AC | PRN

## 2020-03-20 MED ORDER — FENTANYL CITRATE (PF) 100 MCG/2ML IJ SOLN
INTRAMUSCULAR | Status: DC | PRN
  Administered 2020-03-20: 25 ug via INTRAVENOUS
  Administered 2020-03-20: 50 ug via INTRAVENOUS
  Administered 2020-03-20: 25 ug via INTRAVENOUS

## 2020-03-20 MED ORDER — CHLORHEXIDINE GLUCONATE 0.12 % MT SOLN
OROMUCOSAL | Status: AC
  Administered 2020-03-20: 15 mL via OROMUCOSAL
  Filled 2020-03-20: qty 15

## 2020-03-20 MED ORDER — MIDAZOLAM HCL 2 MG/2ML IJ SOLN
INTRAMUSCULAR | Status: AC
  Filled 2020-03-20: qty 2

## 2020-03-20 MED ORDER — ACETAMINOPHEN 10 MG/ML IV SOLN: 1000.0000 mg | Freq: Once | INTRAVENOUS | Status: DC | PRN

## 2020-03-20 MED ORDER — FENTANYL CITRATE (PF) 100 MCG/2ML IJ SOLN
INTRAMUSCULAR | Status: AC
  Filled 2020-03-20: qty 2

## 2020-03-20 MED ORDER — DEXMEDETOMIDINE (PRECEDEX) IN NS 20 MCG/5ML (4 MCG/ML) IV SYRINGE
PREFILLED_SYRINGE | INTRAVENOUS | Status: DC | PRN
  Administered 2020-03-20: 8 ug via INTRAVENOUS
  Administered 2020-03-20: 12 ug via INTRAVENOUS

## 2020-03-20 MED ORDER — PENTAFLUOROPROP-TETRAFLUOROETH EX AERO
INHALATION_SPRAY | CUTANEOUS | Status: AC
  Filled 2020-03-20: qty 30

## 2020-03-20 SURGICAL SUPPLY — 29 items
ABLATOR SURESOUND NOVASURE (ABLATOR) ×2 IMPLANT
BAG DRN RND TRDRP ANRFLXCHMBR (UROLOGICAL SUPPLIES) ×1
BAG URINE DRAIN 2000ML AR STRL (UROLOGICAL SUPPLIES) ×3 IMPLANT
CATH FOLEY 2WAY  5CC 16FR (CATHETERS) ×2
CATH FOLEY 2WAY 5CC 16FR (CATHETERS) ×1
CATH ROBINSON RED A/P 16FR (CATHETERS) ×3 IMPLANT
CATH URTH 16FR FL 2W BLN LF (CATHETERS) ×1 IMPLANT
COVER WAND RF STERILE (DRAPES) ×3 IMPLANT
GAUZE SPONGE 4X4 16PLY XRAY LF (GAUZE/BANDAGES/DRESSINGS) ×2 IMPLANT
GLOVE BIO SURGEON STRL SZ7 (GLOVE) ×7 IMPLANT
GLOVE INDICATOR 7.5 STRL GRN (GLOVE) ×7 IMPLANT
GOWN STRL REUS W/ TWL LRG LVL3 (GOWN DISPOSABLE) ×2 IMPLANT
GOWN STRL REUS W/TWL LRG LVL3 (GOWN DISPOSABLE) ×9
HANDPIECE ABLA MINERVA ENDO (MISCELLANEOUS) IMPLANT
IV LACTATED RINGER IRRG 3000ML (IV SOLUTION) ×3
IV LACTATED RINGERS 1000ML (IV SOLUTION) ×1 IMPLANT
IV LR IRRIG 3000ML ARTHROMATIC (IV SOLUTION) IMPLANT
IV NS 1000ML (IV SOLUTION) ×3
IV NS 1000ML BAXH (IV SOLUTION) ×1 IMPLANT
KIT PROCEDURE FLUENT (KITS) ×2 IMPLANT
KIT TURNOVER CYSTO (KITS) ×3 IMPLANT
NS IRRIG 500ML POUR BTL (IV SOLUTION) ×3 IMPLANT
PACK DNC HYST (MISCELLANEOUS) ×3 IMPLANT
PAD OB MATERNITY 4.3X12.25 (PERSONAL CARE ITEMS) ×3 IMPLANT
PAD PREP 24X41 OB/GYN DISP (PERSONAL CARE ITEMS) ×3 IMPLANT
SYR 10ML LL (SYRINGE) ×3 IMPLANT
TOWEL OR 17X26 4PK STRL BLUE (TOWEL DISPOSABLE) ×3 IMPLANT
TUBING CONNECTING 10 (TUBING) ×2 IMPLANT
TUBING CONNECTING 10' (TUBING) ×1

## 2020-03-20 NOTE — Anesthesia Procedure Notes (Signed)
Procedure Name: Intubation Date/Time: 03/20/2020 11:06 AM Performed by: Arita Miss, MD Pre-anesthesia Checklist: Patient identified, Patient being monitored, Emergency Drugs available and Suction available Patient Re-evaluated:Patient Re-evaluated prior to induction Oxygen Delivery Method: Circle system utilized Preoxygenation: Pre-oxygenation with 100% oxygen Induction Type: IV induction and Rapid sequence Laryngoscope Size: McGraph and 4 Grade View: Grade II Tube type: Oral Tube size: 6.5 mm Number of attempts: 2 Airway Equipment and Method: Stylet and Patient positioned with wedge pillow Placement Confirmation: ETT inserted through vocal cords under direct vision,  positive ETCO2 and breath sounds checked- equal and bilateral Secured at: 21 cm Tube secured with: Tape Dental Injury: Teeth and Oropharynx as per pre-operative assessment  Difficulty Due To: Difficulty was anticipated and Difficult Airway- due to large tongue Future Recommendations: Recommend- induction with short-acting agent, and alternative techniques readily available Comments: Difficult airway equipment in room. Ramped with blankets. RSI performed smooth, uneventful. Mcgrath 3 with a Grade 4 view. Switched to DIRECTV 4 with Grade 2a view, successful intubation. Hemodynamically stable, never desaturated. Maintained neck in neutral position.

## 2020-03-20 NOTE — Transfer of Care (Signed)
Immediate Anesthesia Transfer of Care Note  Patient: Vanessa Cooper  Procedure(s) Performed: DILATATION & CURETTAGE/HYSTEROSCOPY WITH NOVASURE ABLATION (N/A )  Patient Location: PACU  Anesthesia Type:General  Level of Consciousness: alert , oriented and drowsy  Airway & Oxygen Therapy: Patient Spontanous Breathing and Patient connected to face mask oxygen  Post-op Assessment: Report given to RN, Post -op Vital signs reviewed and stable and Patient moving all extremities  Post vital signs: stable  Last Vitals:  Vitals Value Taken Time  BP    Temp    Pulse    Resp    SpO2      Last Pain:  Vitals:   03/20/20 1001  TempSrc: Temporal  PainSc: 0-No pain         Complications: No complications documented.

## 2020-03-20 NOTE — Discharge Instructions (Signed)

## 2020-03-20 NOTE — Progress Notes (Signed)
Called Dr. Bertell Maria for pain meds due to pain level increase after urination. He is okay with oxy IR 5 mg

## 2020-03-20 NOTE — Interval H&P Note (Signed)
History and Physical Interval Note:  03/20/2020 9:43 PM  Vanessa Cooper  has presented today for surgery, with the diagnosis of Menorrhagia with irreguler cycle N92.1.  The various methods of treatment have been discussed with the patient and family. After consideration of risks, benefits and other options for treatment, the patient has consented to  Procedure(s): [place] (N/A) as a surgical intervention.  The patient's history has been reviewed, patient examined, no change in status, stable for surgery.  I have reviewed the patient's chart and labs.  Questions were answered to the patient's satisfaction.     Prentice Docker, MD, [facility], [facility] 03/20/2020 9:43 PM

## 2020-03-20 NOTE — Anesthesia Postprocedure Evaluation (Signed)
Anesthesia Post Note  Patient: Vanessa Cooper  Procedure(s) Performed: DILATATION & CURETTAGE/HYSTEROSCOPY WITH NOVASURE ABLATION (N/A )  Patient location during evaluation: PACU Anesthesia Type: General Level of consciousness: awake and alert Pain management: pain level controlled Vital Signs Assessment: post-procedure vital signs reviewed and stable Respiratory status: spontaneous breathing, nonlabored ventilation, respiratory function stable and patient connected to nasal cannula oxygen Cardiovascular status: blood pressure returned to baseline and stable Postop Assessment: no apparent nausea or vomiting Anesthetic complications: no   No complications documented.   Last Vitals:  Vitals:   03/20/20 1230 03/20/20 1243  BP: 127/80 134/86  Pulse: 90 92  Resp: 18 16  Temp: 36.6 C 36.6 C  SpO2: 95% 96%    Last Pain:  Vitals:   03/20/20 1315  TempSrc:   PainSc: 4                  Arita Miss

## 2020-03-20 NOTE — Anesthesia Preprocedure Evaluation (Signed)
Anesthesia Evaluation  Patient identified by MRN, date of birth, ID band Patient awake    Reviewed: Allergy & Precautions, NPO status , Patient's Chart, lab work & pertinent test results  History of Anesthesia Complications Negative for: history of anesthetic complications  Airway Mallampati: III  TM Distance: <3 FB Neck ROM: Full    Dental  (+) Edentulous Upper, Edentulous Lower   Pulmonary sleep apnea , neg COPD, Patient abstained from smoking.Not current smoker,  Signs suggestive of OSA - large neck, observed apnea by family during sleep   Pulmonary exam normal breath sounds clear to auscultation       Cardiovascular Exercise Tolerance: Good METS(-) hypertension(-) CAD and (-) Past MI (-) dysrhythmias  Rhythm:Regular Rate:Normal - Systolic murmurs Mother says patient may have had echocardiogram long time ago when patient was younger, was not informed of any major issues. No known cardiac issues.  Possible I/IV systolic murmur at upper left sternal border, very difficult to discern due to large body habitus.   Neuro/Psych PSYCHIATRIC DISORDERS Anxiety Trisomy 21 - down syndrome  No known neck issues or involvement; patient has great neck extension    GI/Hepatic neg GERD  ,(+)     (-) substance abuse  ,   Endo/Other  neg diabetesMorbid obesity  Renal/GU negative Renal ROS     Musculoskeletal   Abdominal (+) + obese,   Peds  Hematology   Anesthesia Other Findings Past Medical History: No date: Anxiety No date: Down syndrome No date: GERD (gastroesophageal reflux disease) No date: No known health problems  Reproductive/Obstetrics                             Anesthesia Physical Anesthesia Plan  ASA: III  Anesthesia Plan: General   Post-op Pain Management:    Induction: Intravenous and Rapid sequence  PONV Risk Score and Plan: 4 or greater and Ondansetron, Dexamethasone,  Midazolam and Treatment may vary due to age or medical condition  Airway Management Planned: Oral ETT and Video Laryngoscope Planned  Additional Equipment: None  Intra-op Plan:   Post-operative Plan: Extubation in OR  Informed Consent: I have reviewed the patients History and Physical, chart, labs and discussed the procedure including the risks, benefits and alternatives for the proposed anesthesia with the patient or authorized representative who has indicated his/her understanding and acceptance.     Dental advisory given and Consent reviewed with POA  Plan Discussed with: CRNA and Surgeon  Anesthesia Plan Comments: (Discussed risks of anesthesia with patient and sister and mother, including PONV, sore throat, lip/dental damage. Rare risks discussed as well, such as cardiorespiratory and neurological sequelae. Patient understands. Patient informed about increased incidence of above perioperative risk due to high BMI. Patient understands. Difficult airway equipment will be present in room )        Anesthesia Quick Evaluation

## 2020-03-20 NOTE — Op Note (Signed)
Operative Note   Name: Vanessa Cooper  Date of Birth: 08/06/1975  MRN: [id number]  Date of Service: 03/20/2020    PRE-OP DIAGNOSIS: Menorrhagia with irregular cycle  POST-OP DIAGNOSIS: Menorrhagia with irregular cycle  SURGEON: Will Bonnet, MD  PROCEDURE: DILATATION & CURETTAGE/HYSTEROSCOPY WITH NOVASURE ABLATION  ANESTHESIA: General   ESTIMATED BLOOD LOSS: 8mL   IV Fluid: 700 mL crystalloid  SPECIMENS: endometrial curettings  FLUID DEFICIT: 150 mL   COMPLICATIONS: none   DISPOSITION: PACU - hemodynamically stable.   CONDITION: stable   FINDINGS: Exam under anesthesia revealed small, mobile uterus with no masses and bilateral adnexa without masses or fullness. Hysteroscopy revealed grossly normal appearing uterine cavity with bilateral tubal ostia and normal appearing endocervical canal. She did have either a septate or arcuate uterus.  Findings after ablation revealed distally ablated endometrium with the cornu and fundus not well ablated.    PROCEDURE IN DETAIL: After informed consent was obtained, the patient was taken to the operating room where anesthesia was obtained without difficulty. The patient was positioned in the dorsal lithotomy position in candy cane stirrups. The patient's bladder was catheterized with an in and out foley catheter. The patient was examined under anesthesia, with the above noted findings. The bivalved speculum was placed inside the patient's vagina, and the the anterior lip of the cervix was seen and grasped with the tenaculum. The uterine cavity was sounded to 9 cm, and then the cervix was progressively dilated to a 81mm using Hegar dilators. The 30 degree hysteroscope was introduced, with LR fluid used to distend the intrauterine cavity, with the above noted findings.   The hystersocope was removed and the uterine cavity was curetted until a gritty texture was noted, yielding endometrial curettings.    The NovaSure device was then placed  without difficulty. Measurements were obtained. Patient was noted to have a uterine length of 4 cm, a cervical length of 5 cm, and a uterine width of 3.3 cm. The NovaSure device was first tested and after confirmation the procedures performed. Length of procedure was 58 seconds. The NovaSure device is then removed and repeat hysteroscopy reveals an appropriate lining of the uterus and no perforation or injury. Hysteroscope is removed with above-noted discrepancy of fluid.   Tenaculum was removed with excellent hemostasis noted after application of silver nitrate to the tenaculum entry sites. She was then taken out of dorsal lithotomy. Hemostasis noted.  The patient tolerated the procedure well. Sponge, lap and needle counts were correct x2. The patient was taken to recovery room in excellent condition.  Prentice Docker, MD 03/20/2020 11:53 AM

## 2020-03-21 ENCOUNTER — Encounter: Payer: Self-pay | Admitting: Obstetrics and Gynecology

## 2020-03-21 LAB — SURGICAL PATHOLOGY

## 2020-04-03 ENCOUNTER — Ambulatory Visit: Payer: Medicare Other | Admitting: Obstetrics and Gynecology

## 2020-04-15 ENCOUNTER — Ambulatory Visit: Payer: Medicare Other | Admitting: Obstetrics and Gynecology

## 2020-05-05 ENCOUNTER — Ambulatory Visit: Payer: Medicare Other | Admitting: Obstetrics and Gynecology

## 2021-02-19 DIAGNOSIS — Z01419 Encounter for gynecological examination (general) (routine) without abnormal findings: Secondary | ICD-10-CM | POA: Diagnosis not present

## 2021-02-19 DIAGNOSIS — Z114 Encounter for screening for human immunodeficiency virus [HIV]: Secondary | ICD-10-CM | POA: Diagnosis not present

## 2021-02-19 DIAGNOSIS — F418 Other specified anxiety disorders: Secondary | ICD-10-CM | POA: Diagnosis not present

## 2021-02-19 DIAGNOSIS — Z79899 Other long term (current) drug therapy: Secondary | ICD-10-CM | POA: Diagnosis not present

## 2021-02-19 DIAGNOSIS — Q909 Down syndrome, unspecified: Secondary | ICD-10-CM | POA: Diagnosis not present

## 2021-02-19 DIAGNOSIS — M545 Low back pain, unspecified: Secondary | ICD-10-CM | POA: Diagnosis not present

## 2021-02-19 DIAGNOSIS — B9689 Other specified bacterial agents as the cause of diseases classified elsewhere: Secondary | ICD-10-CM | POA: Diagnosis not present

## 2021-02-19 DIAGNOSIS — Z9181 History of falling: Secondary | ICD-10-CM | POA: Diagnosis not present

## 2021-02-19 DIAGNOSIS — N898 Other specified noninflammatory disorders of vagina: Secondary | ICD-10-CM | POA: Diagnosis not present

## 2021-02-19 DIAGNOSIS — G2581 Restless legs syndrome: Secondary | ICD-10-CM | POA: Diagnosis not present

## 2021-02-19 DIAGNOSIS — E7800 Pure hypercholesterolemia, unspecified: Secondary | ICD-10-CM | POA: Diagnosis not present

## 2021-02-19 DIAGNOSIS — Z23 Encounter for immunization: Secondary | ICD-10-CM | POA: Diagnosis not present

## 2021-02-19 DIAGNOSIS — N760 Acute vaginitis: Secondary | ICD-10-CM | POA: Diagnosis not present

## 2021-02-19 DIAGNOSIS — Z1331 Encounter for screening for depression: Secondary | ICD-10-CM | POA: Diagnosis not present

## 2021-02-19 DIAGNOSIS — E039 Hypothyroidism, unspecified: Secondary | ICD-10-CM | POA: Diagnosis not present

## 2021-06-03 ENCOUNTER — Emergency Department (HOSPITAL_COMMUNITY): Payer: Medicare Other

## 2021-06-03 ENCOUNTER — Emergency Department (HOSPITAL_COMMUNITY)
Admission: EM | Admit: 2021-06-03 | Discharge: 2021-06-04 | Disposition: A | Discharge status: Home / Self Care | Payer: Medicare Other | Attending: Emergency Medicine | Admitting: Emergency Medicine

## 2021-06-03 DIAGNOSIS — R202 Paresthesia of skin: Secondary | ICD-10-CM | POA: Insufficient documentation

## 2021-06-03 DIAGNOSIS — I10 Essential (primary) hypertension: Secondary | ICD-10-CM | POA: Diagnosis not present

## 2021-06-03 DIAGNOSIS — T17920A Food in respiratory tract, part unspecified causing asphyxiation, initial encounter: Secondary | ICD-10-CM | POA: Diagnosis not present

## 2021-06-03 DIAGNOSIS — R0602 Shortness of breath: Secondary | ICD-10-CM | POA: Diagnosis not present

## 2021-06-03 DIAGNOSIS — Z743 Need for continuous supervision: Secondary | ICD-10-CM | POA: Diagnosis not present

## 2021-06-03 DIAGNOSIS — I517 Cardiomegaly: Secondary | ICD-10-CM | POA: Diagnosis not present

## 2021-06-03 DIAGNOSIS — R9431 Abnormal electrocardiogram [ECG] [EKG]: Secondary | ICD-10-CM | POA: Diagnosis not present

## 2021-06-03 DIAGNOSIS — R0689 Other abnormalities of breathing: Secondary | ICD-10-CM | POA: Diagnosis not present

## 2021-06-03 DIAGNOSIS — R000 Tachycardia, unspecified: Secondary | ICD-10-CM | POA: Diagnosis not present

## 2021-06-03 LAB — I-STAT CHEM 8, ED
BUN: 5 mg/dL — ABNORMAL LOW (ref 6–20)
Calcium, Ion: 1.13 mmol/L — ABNORMAL LOW (ref 1.15–1.40)
Chloride: 102 mmol/L (ref 98–111)
Creatinine, Ser: 0.6 mg/dL (ref 0.44–1.00)
Glucose, Bld: 89 mg/dL (ref 70–99)
HCT: 47 % — ABNORMAL HIGH (ref 36.0–46.0)
Hemoglobin: 16 g/dL — ABNORMAL HIGH (ref 12.0–15.0)
Potassium: 3.8 mmol/L (ref 3.5–5.1)
Sodium: 137 mmol/L (ref 135–145)
TCO2: 26 mmol/L (ref 22–32)

## 2021-06-03 MED ORDER — LORAZEPAM 1 MG PO TABS
1.0000 mg | ORAL_TABLET | Freq: Once | ORAL | Status: AC
  Administered 2021-06-03: 1 mg via ORAL
  Filled 2021-06-03: qty 1

## 2021-06-03 NOTE — ED Provider Notes (Signed)
[facility] EMERGENCY DEPARTMENT Provider Note   CSN: [id number] Arrival date & time: 06/03/21  2224     History  No chief complaint on file.   Vanessa Cooper is a 46 y.o. female.  46 year old female with a history of esophageal reflux, anxiety presents to the emergency department for shortness of breath.  She states that she began feeling short of breath tonight when she was at home alone.  She has a history of anxiety and reports that her symptoms tonight feel consistent with an anxiety attack.  She is prescribed hydroxyzine, but chose not to take it as she does not like how it makes her feel.  Reports that she has discussed this with her doctor, but there have been no changes to her medication regimen.  Is describing some chest tightness as well as tingling in her bilateral hands.  She has not had any fevers, syncope, vomiting.  Transported to the ED via EMS.  The history is provided by the patient. No language interpreter was used.      Home Medications Prior to Admission medications   Medication Sig Start Date End Date Taking? Authorizing Provider  gabapentin (NEURONTIN) 300 MG capsule Take 300 mg by mouth at bedtime.    [provider]  ibuprofen (ADVIL) 600 MG tablet Take 1 tablet (600 mg total) by mouth every 8 (eight) hours as needed for mild pain. 03/20/20   Will Bonnet, MD  medroxyPROGESTERone (DEPO-PROVERA) 150 MG/ML injection Inject 150 mg into the muscle every 3 (three) months. 10/10/19   [provider]  nystatin cream (MYCOSTATIN) Apply 1 application topically 2 (two) times daily. Apply to affected area as noted until resolves 03/20/20   Will Bonnet, MD      Allergies    Patient has no known allergies.    Review of Systems   Review of Systems Ten systems reviewed and are negative for acute change, except as noted in the HPI.    Physical Exam Updated Vital Signs BP (!) 136/103 (BP Location: Right Arm)    Pulse 89     Temp 99.2 F (37.3 C) (Oral)    Resp 18    SpO2 94%   Physical Exam Vitals and nursing note reviewed.  Constitutional:      General: She is not in acute distress.    Appearance: She is well-developed. She is obese. She is not diaphoretic.     Comments: Anxious appearing; hyperventilating  HENT:     Head: Normocephalic and atraumatic.  Eyes:     General: No scleral icterus.    Conjunctiva/sclera: Conjunctivae normal.  Cardiovascular:     Rate and Rhythm: Normal rate and regular rhythm.     Pulses: Normal pulses.  Pulmonary:     Effort: Tachypnea present.     Breath sounds: No stridor. No wheezing, rhonchi or rales.     Comments: Lungs CTAB Musculoskeletal:        General: Normal range of motion.     Cervical back: Normal range of motion.  Skin:    General: Skin is warm and dry.     Coloration: Skin is not pale.     Findings: No erythema or rash.  Neurological:     Mental Status: She is alert and oriented to person, place, and time.     Coordination: Coordination normal.     Comments: Moving all extremities spontaneously; purposeful movements.  Psychiatric:        Behavior: Behavior normal.  ED Results / Procedures / Treatments   Labs (all labs ordered are listed, but only abnormal results are displayed) Labs Reviewed  I-STAT CHEM 8, ED - Abnormal; Notable for the following components:      Result Value   BUN 5 (*)    Calcium, Ion 1.13 (*)    Hemoglobin 16.0 (*)    HCT 47.0 (*)    All other components within normal limits    EKG EKG Interpretation  Date/Time:  Wednesday June 03 2021 22:28:16 EST Ventricular Rate:  84 PR Interval:  155 QRS Duration: 81 QT Interval:  499 QTC Calculation: 590 R Axis:   18 Text Interpretation: Sinus rhythm Low voltage, precordial leads Borderline repol abnrm, anterolateral leads Baseline wander in lead(s) II III aVF V2 V3 V5 V6 Confirmed by Nanda Quinton [phone]) on 06/04/2021 2:43:33 AM  Radiology DG Chest Port 1  View  Result Date: 06/03/2021 CLINICAL DATA:  Shortness of breath EXAM: PORTABLE CHEST 1 VIEW COMPARISON:  12/13/2005 FINDINGS: Cardiomegaly. No confluent airspace opacities or effusions. No acute bony abnormality. IMPRESSION: Cardiomegaly.  No active disease. Electronically Signed   By: Rolm Baptise M.D.   On: 06/03/2021 23:13     Procedures Procedures    Medications Ordered in ED Medications  LORazepam (ATIVAN) tablet 1 mg (1 mg Oral Given 06/03/21 2311)  hydrOXYzine (ATARAX) tablet 25 mg (25 mg Oral Given 06/04/21 0216)    ED Course/ Medical Decision Making/ A&P Clinical Course as of 06/19/21 1454  Wed Jun 03, 2021  2359 Chest x-ray negative for pneumothorax, pneumonia, pleural effusion.  There is cardiomegaly without overt signs of heart failure.  Well's PE score c/w low risk.  Chemistry reviewed without anemia.  No significant electrolyte derangements.  Kidney function preserved. No evidence of underlying arrhythmia or acute ischemia on EKG.  [KH]  Thu Jun 04, 2021  0133 Patient reassessed. Breathing has improved. She reports some lessening of her SOB and anxiety. Requesting additional medication for this. Will give prescribed Hydroxyzine. Anticipate discharge with outpatient f/u. [KH]    Clinical Course User Index [KH] Antonietta Breach, PA-C                           Medical Decision Making Amount and/or Complexity of Data Reviewed External Data Reviewed: labs, radiology and ECG. Labs: ordered. Decision-making details documented in ED Course. Radiology: ordered and independent interpretation performed. ECG/medicine tests: ordered. Decision-making details documented in ED Course.  Risk Prescription drug management.   This patient presents to the ED for concern of SOB, this involves an extensive number of treatment options, and is a complaint that carries with it a high risk of complications and morbidity.  The differential diagnosis includes anxiety vs PNA vs COPD vs PTX vs  effusion vs CHF vs PE vs ACS vs pericardial effusion vs cardiac arrhythmia vs symptomatic anemia vs viral illness vs substance abuse   Co morbidities that complicate the patient evaluation  Anxiety Obesity   Additional history obtained:  Additional history obtained from EMS External records from outside source obtained and reviewed including Care Everywhere   Lab Tests:  I Ordered, and personally interpreted labs.  The pertinent results include: H/H of 16/47, no electrolyte derangements, preserved creatinine   Imaging Studies ordered:  I ordered imaging studies including CXR  I independently visualized and interpreted imaging which showed cardiomegaly, uncertain chronicity I agree with the radiologist interpretation   Cardiac Monitoring:  The patient was maintained on  a cardiac monitor.  I personally viewed and interpreted the cardiac monitored which showed an underlying rhythm of: NSR   Medicines ordered and prescription drug management:  I ordered medication including Ativan for SOB, presumed anxiety  Reevaluation of the patient after these medicines showed that the patient improved I have reviewed the patients home medicines and have made adjustments as needed   Test Considered:  Troponin   Problem List / ED Course:  As above   Reevaluation:  After the interventions noted above, I reevaluated the patient and found that they have :improved   Dispostion:  Admission considered; however, after consideration of the diagnostic results and the patients response to treatment, I feel that the patent would benefit from close outpatient PCP follow up. Already prescribed hydroxyzine, per patient; advised continued use PRN. Return precautions discussed and provided. Patient discharged in stable condition with no unaddressed concerns.         Final Clinical Impression(s) / ED Diagnoses Final diagnoses:  Shortness of breath    Rx / DC Orders ED Discharge  Orders     None         Antonietta Breach, PA-C 06/19/21 1455    Long, Wonda Old[facility], MD 06/25/21 1620

## 2021-06-03 NOTE — ED Triage Notes (Signed)
Pt BIB [facility] from home d/t anxiety. EMS was dispatched to pt's house for SOB. Upon arrival, pt was waiting outside for EMS. Pt has mental disorder. VS stable w EMS.   BP 170/90 HR 90 SPO2 100% RA CBG 106

## 2021-06-04 DIAGNOSIS — I517 Cardiomegaly: Secondary | ICD-10-CM | POA: Diagnosis not present

## 2021-06-04 MED ORDER — HYDROXYZINE HCL 25 MG PO TABS
25.0000 mg | ORAL_TABLET | Freq: Once | ORAL | Status: AC
  Administered 2021-06-04: 25 mg via ORAL
  Filled 2021-06-04: qty 1

## 2021-06-04 NOTE — Discharge Instructions (Addendum)
Your evaluation in the ED has been reassuring.  We recommend close follow-up with your primary care doctor to discuss potential changes to your anxiety medications.  In the interim, we do advise continued use of your prescribed hydroxyzine.  Return to the ED for new or concerning symptoms.

## 2021-06-04 NOTE — ED Notes (Signed)
Sister called to let her know that pt has been discharged

## 2022-01-24 DIAGNOSIS — M549 Dorsalgia, unspecified: Secondary | ICD-10-CM | POA: Diagnosis not present

## 2022-01-24 DIAGNOSIS — N946 Dysmenorrhea, unspecified: Secondary | ICD-10-CM | POA: Diagnosis not present

## 2022-01-24 DIAGNOSIS — G8929 Other chronic pain: Secondary | ICD-10-CM | POA: Diagnosis not present

## 2022-01-24 DIAGNOSIS — M542 Cervicalgia: Secondary | ICD-10-CM | POA: Diagnosis not present

## 2022-01-24 DIAGNOSIS — Z885 Allergy status to narcotic agent status: Secondary | ICD-10-CM | POA: Diagnosis not present

## 2022-03-09 DIAGNOSIS — Z1331 Encounter for screening for depression: Secondary | ICD-10-CM | POA: Diagnosis not present

## 2022-03-09 DIAGNOSIS — M5432 Sciatica, left side: Secondary | ICD-10-CM | POA: Diagnosis not present

## 2022-03-09 DIAGNOSIS — Z23 Encounter for immunization: Secondary | ICD-10-CM | POA: Diagnosis not present

## 2022-03-09 DIAGNOSIS — N921 Excessive and frequent menstruation with irregular cycle: Secondary | ICD-10-CM | POA: Diagnosis not present

## 2022-03-09 DIAGNOSIS — M545 Low back pain, unspecified: Secondary | ICD-10-CM | POA: Diagnosis not present

## 2022-03-14 IMAGING — US US PELVIS COMPLETE WITH TRANSVAGINAL
1 series · 14 of 20 positions shown · non-contrast
Comparison: None

CLINICAL DATA: 44-year-old female with pelvic pain and vaginal
bleeding.

EXAM:
TRANSABDOMINAL AND TRANSVAGINAL ULTRASOUND OF PELVIS
TECHNIQUE: Both transabdominal and transvaginal ultrasound examinations of the
pelvis were performed. Transabdominal technique was performed for
global imaging of the pelvis including uterus, ovaries, adnexal
regions, and pelvic cul-de-sac. It was necessary to proceed with
endovaginal exam following the transabdominal exam to visualize the
adnexal regions and endometrium.

[Series 1: us pelvic complete w transvaginal and torsion righ · 20 acquisitions, 14 frames shown]
[im 1/20]
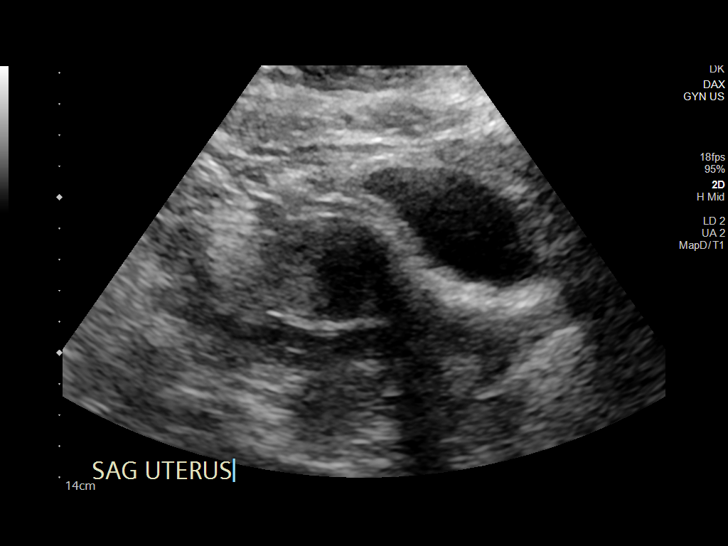
[im 3/20]
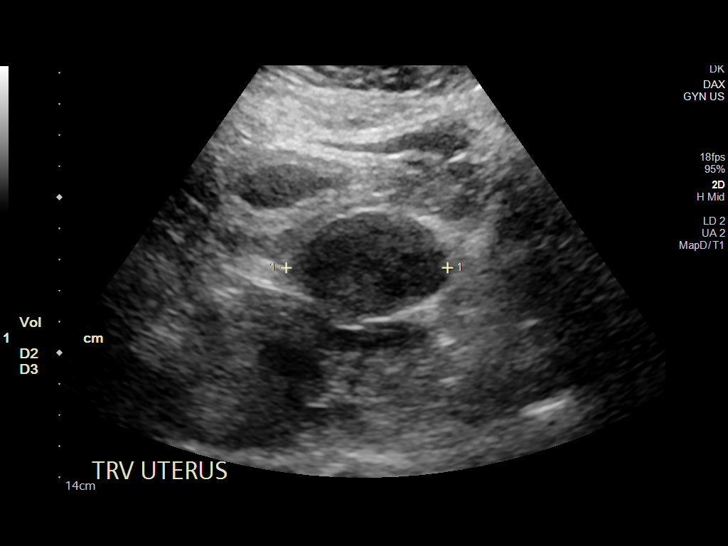
[im 4/20]
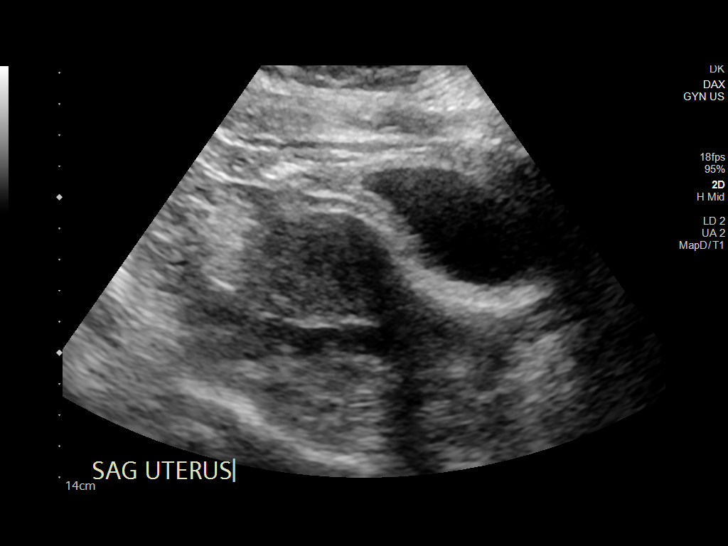
[im 6/20]
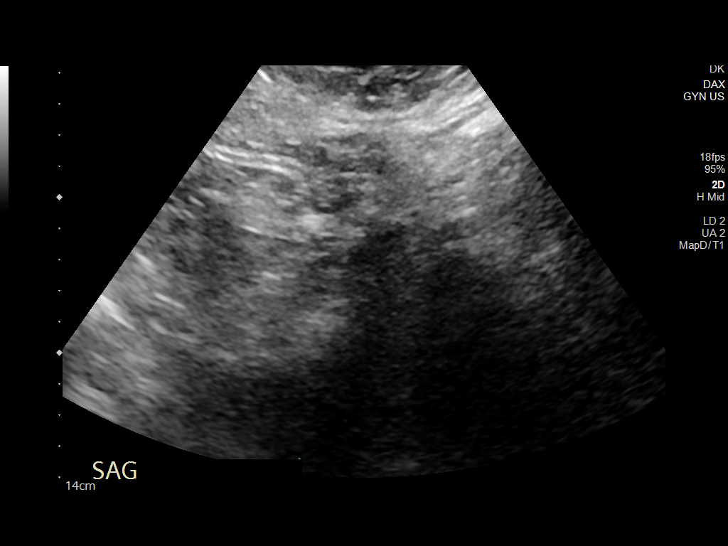
[im 7/20]
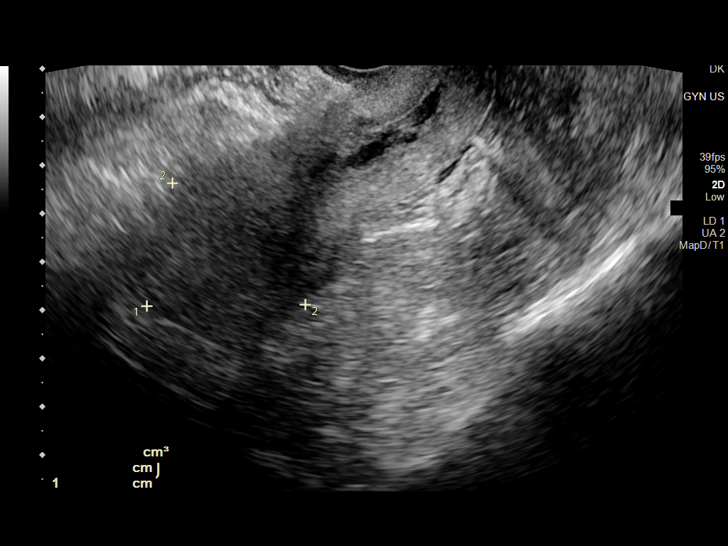
[im 8/20]
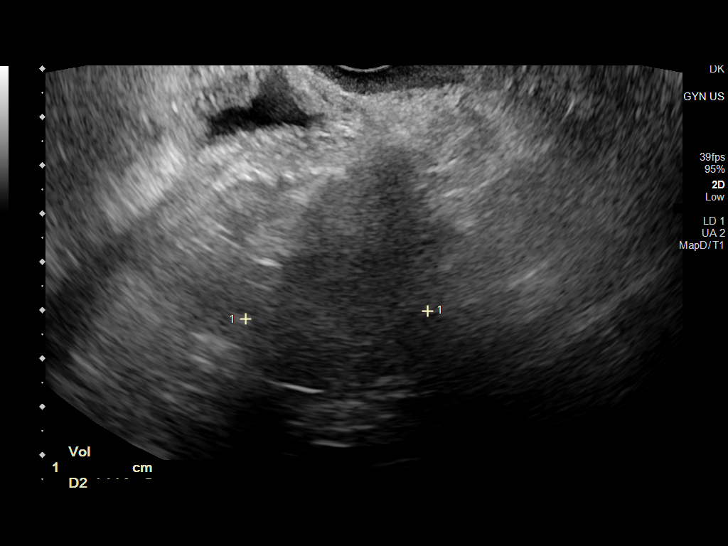
[im 10/20]
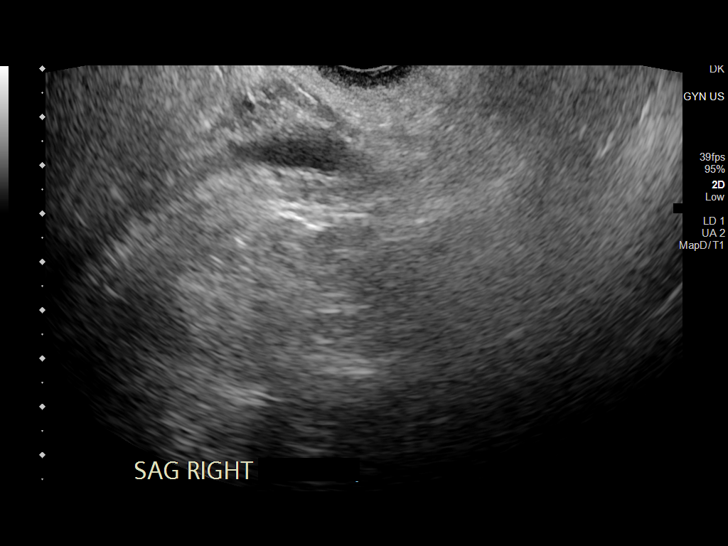
[im 11/20]
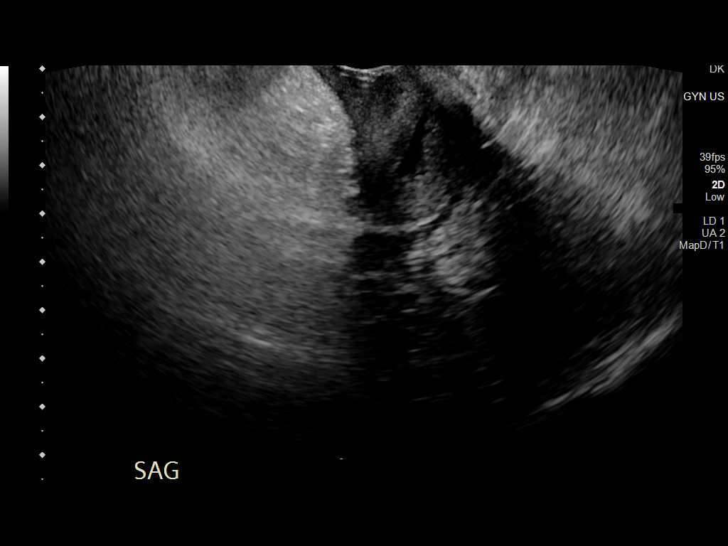
[im 13/20]
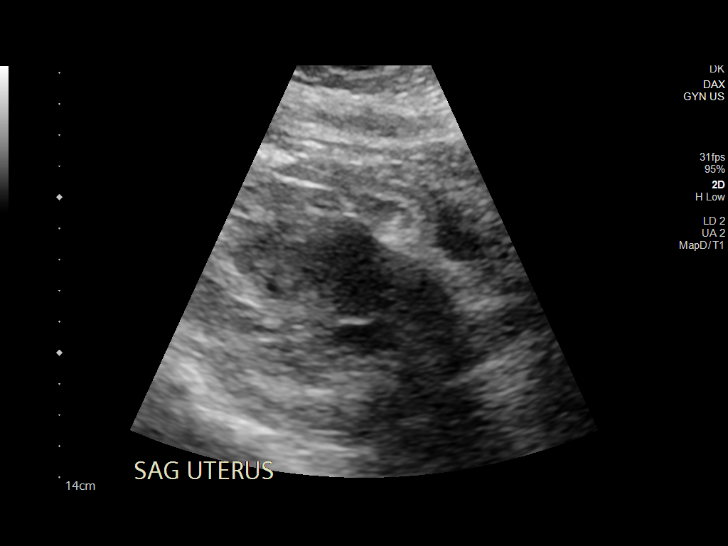
[im 14/20]
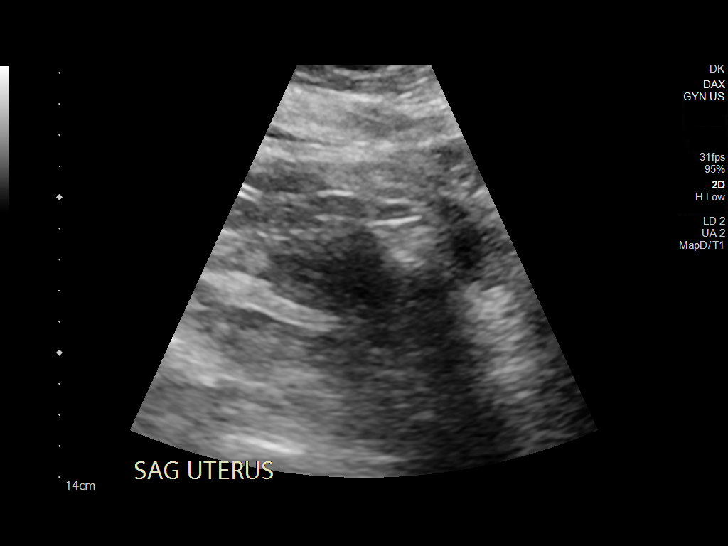
[im 16/20]
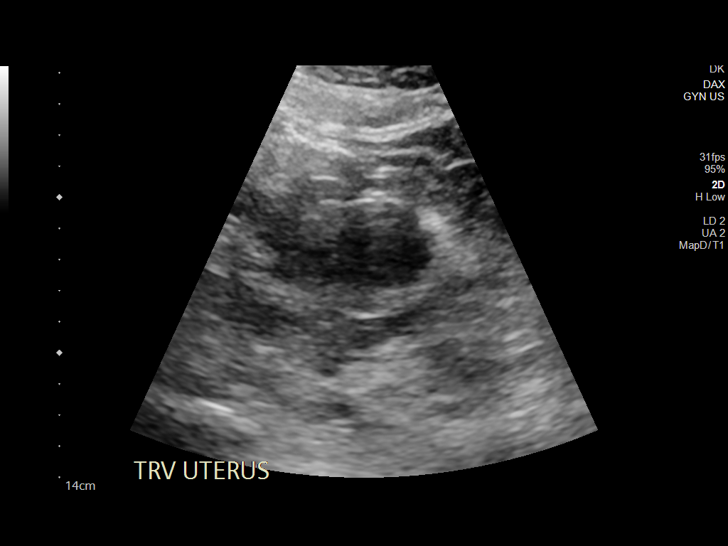
[im 17/20]
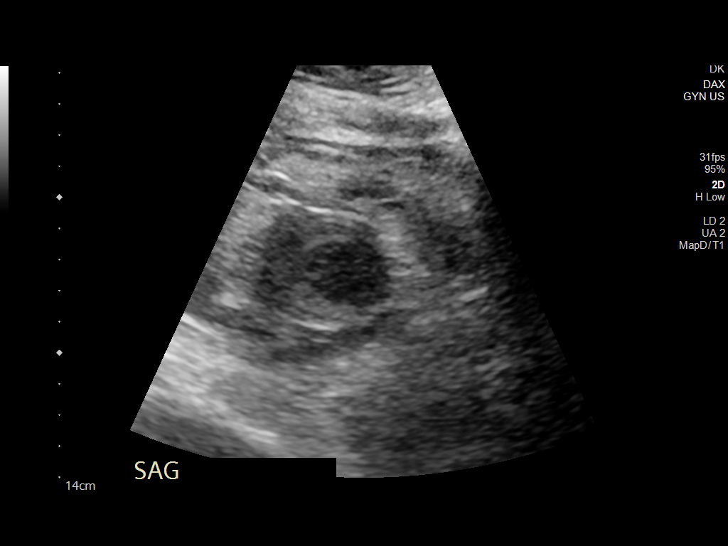
[im 18/20]
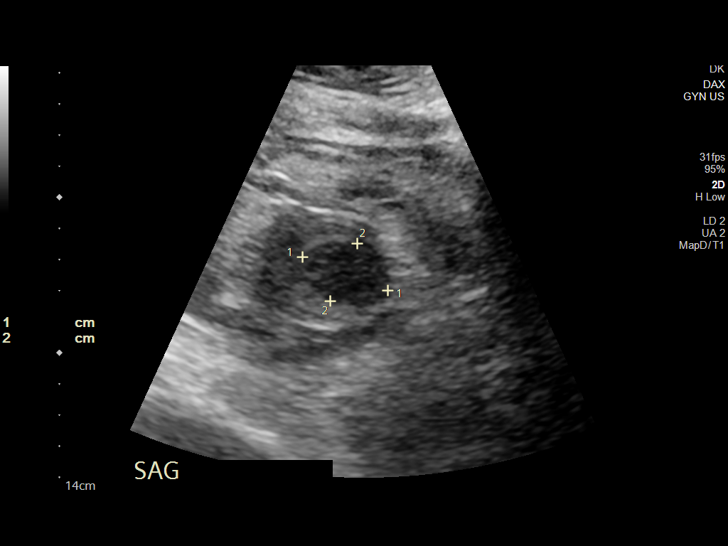
[im 20/20]
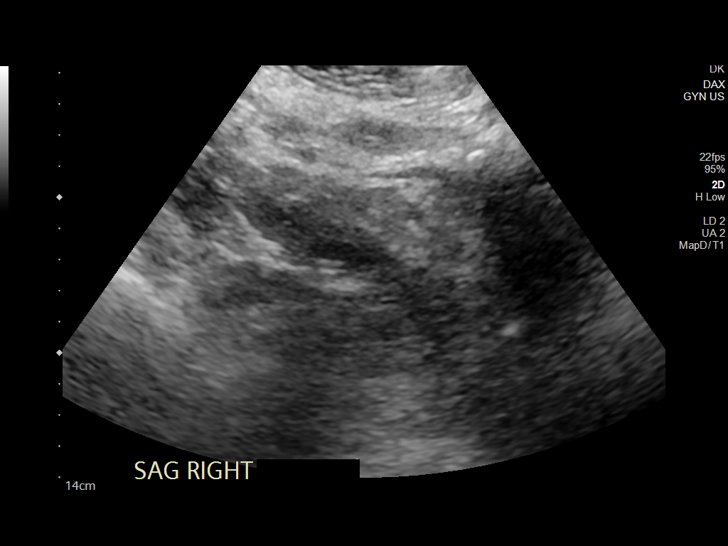

[14 of 20 positions shown; findings below may reference images not displayed]

FINDINGS: Uterus

Measurements: 8.8 x 3.2 x 5.2 cm = volume: 73 mL. A 3 x 2 x 3.1 cm
LEFT uterine body/fundal fibroid is noted abutting the endometrial
stripe.

Endometrium

Thickness: 2 mm.  No focal abnormality visualized.

Right ovary

Not visualized.

Left ovary

Not visualized

Other findings

No adnexal mass or abnormal free fluid.
IMPRESSION: 1. 3.1 cm LEFT uterine body/fundal fibroid abutting the endometrial
stripe.
2. Normal endometrial stripe thickness.
3. Ovaries not visualized.  No free fluid or adnexal mass.

## 2023-01-04 DIAGNOSIS — M545 Low back pain, unspecified: Secondary | ICD-10-CM | POA: Diagnosis not present

## 2023-01-04 DIAGNOSIS — M47814 Spondylosis without myelopathy or radiculopathy, thoracic region: Secondary | ICD-10-CM | POA: Diagnosis not present

## 2023-01-04 DIAGNOSIS — M47816 Spondylosis without myelopathy or radiculopathy, lumbar region: Secondary | ICD-10-CM | POA: Diagnosis not present

## 2023-01-04 DIAGNOSIS — M546 Pain in thoracic spine: Secondary | ICD-10-CM | POA: Diagnosis not present

## 2023-01-04 DIAGNOSIS — M5459 Other low back pain: Secondary | ICD-10-CM | POA: Diagnosis not present

## 2023-01-20 DIAGNOSIS — W1830XA Fall on same level, unspecified, initial encounter: Secondary | ICD-10-CM | POA: Diagnosis not present

## 2023-01-20 DIAGNOSIS — S6991XA Unspecified injury of right wrist, hand and finger(s), initial encounter: Secondary | ICD-10-CM | POA: Diagnosis not present

## 2023-01-20 DIAGNOSIS — Z6841 Body Mass Index (BMI) 40.0 and over, adult: Secondary | ICD-10-CM | POA: Diagnosis not present

## 2023-01-20 DIAGNOSIS — Z885 Allergy status to narcotic agent status: Secondary | ICD-10-CM | POA: Diagnosis not present

## 2023-01-20 DIAGNOSIS — S50912A Unspecified superficial injury of left forearm, initial encounter: Secondary | ICD-10-CM | POA: Diagnosis not present

## 2023-01-20 DIAGNOSIS — S59911A Unspecified injury of right forearm, initial encounter: Secondary | ICD-10-CM | POA: Diagnosis not present

## 2023-02-06 DIAGNOSIS — K219 Gastro-esophageal reflux disease without esophagitis: Secondary | ICD-10-CM | POA: Diagnosis not present

## 2023-02-06 DIAGNOSIS — Z1152 Encounter for screening for COVID-19: Secondary | ICD-10-CM | POA: Diagnosis not present

## 2023-02-06 DIAGNOSIS — R0789 Other chest pain: Secondary | ICD-10-CM | POA: Diagnosis not present

## 2023-02-06 DIAGNOSIS — R079 Chest pain, unspecified: Secondary | ICD-10-CM | POA: Diagnosis not present

## 2023-02-06 DIAGNOSIS — F419 Anxiety disorder, unspecified: Secondary | ICD-10-CM | POA: Diagnosis not present

## 2023-02-06 DIAGNOSIS — Z6841 Body Mass Index (BMI) 40.0 and over, adult: Secondary | ICD-10-CM | POA: Diagnosis not present

## 2023-02-06 DIAGNOSIS — R0602 Shortness of breath: Secondary | ICD-10-CM | POA: Diagnosis not present

## 2023-02-06 DIAGNOSIS — Z20822 Contact with and (suspected) exposure to covid-19: Secondary | ICD-10-CM | POA: Diagnosis not present

## 2023-02-06 DIAGNOSIS — Z885 Allergy status to narcotic agent status: Secondary | ICD-10-CM | POA: Diagnosis not present

## 2023-02-06 DIAGNOSIS — F430 Acute stress reaction: Secondary | ICD-10-CM | POA: Diagnosis not present

## 2023-02-06 DIAGNOSIS — F439 Reaction to severe stress, unspecified: Secondary | ICD-10-CM | POA: Diagnosis not present

## 2023-06-09 DIAGNOSIS — M545 Low back pain, unspecified: Secondary | ICD-10-CM | POA: Diagnosis not present

## 2023-06-09 DIAGNOSIS — G8929 Other chronic pain: Secondary | ICD-10-CM | POA: Diagnosis not present

## 2023-07-23 DIAGNOSIS — M79604 Pain in right leg: Secondary | ICD-10-CM | POA: Diagnosis not present

## 2023-07-23 DIAGNOSIS — Z79891 Long term (current) use of opiate analgesic: Secondary | ICD-10-CM | POA: Diagnosis not present

## 2023-07-23 DIAGNOSIS — Z6841 Body Mass Index (BMI) 40.0 and over, adult: Secondary | ICD-10-CM | POA: Diagnosis not present

## 2023-07-23 DIAGNOSIS — Z885 Allergy status to narcotic agent status: Secondary | ICD-10-CM | POA: Diagnosis not present

## 2023-07-23 DIAGNOSIS — M79671 Pain in right foot: Secondary | ICD-10-CM | POA: Diagnosis not present

## 2023-07-23 DIAGNOSIS — G8929 Other chronic pain: Secondary | ICD-10-CM | POA: Diagnosis not present

## 2023-07-23 DIAGNOSIS — S99921A Unspecified injury of right foot, initial encounter: Secondary | ICD-10-CM | POA: Diagnosis not present

## 2023-07-23 DIAGNOSIS — M25561 Pain in right knee: Secondary | ICD-10-CM | POA: Diagnosis not present

## 2023-07-23 DIAGNOSIS — S8991XA Unspecified injury of right lower leg, initial encounter: Secondary | ICD-10-CM | POA: Diagnosis not present

## 2023-07-23 DIAGNOSIS — M545 Low back pain, unspecified: Secondary | ICD-10-CM | POA: Diagnosis not present

## 2023-07-27 DIAGNOSIS — M79671 Pain in right foot: Secondary | ICD-10-CM | POA: Diagnosis not present

## 2023-07-27 DIAGNOSIS — E669 Obesity, unspecified: Secondary | ICD-10-CM | POA: Diagnosis not present

## 2023-07-27 DIAGNOSIS — M7989 Other specified soft tissue disorders: Secondary | ICD-10-CM | POA: Diagnosis not present

## 2023-07-27 DIAGNOSIS — M545 Low back pain, unspecified: Secondary | ICD-10-CM | POA: Diagnosis not present

## 2023-07-27 DIAGNOSIS — M25571 Pain in right ankle and joints of right foot: Secondary | ICD-10-CM | POA: Diagnosis not present

## 2023-07-27 DIAGNOSIS — G8929 Other chronic pain: Secondary | ICD-10-CM | POA: Diagnosis not present

## 2023-07-28 DIAGNOSIS — M79671 Pain in right foot: Secondary | ICD-10-CM | POA: Diagnosis not present

## 2023-07-28 DIAGNOSIS — M7989 Other specified soft tissue disorders: Secondary | ICD-10-CM | POA: Diagnosis not present

## 2023-08-04 DIAGNOSIS — M25511 Pain in right shoulder: Secondary | ICD-10-CM | POA: Diagnosis not present

## 2023-08-04 DIAGNOSIS — M62838 Other muscle spasm: Secondary | ICD-10-CM | POA: Diagnosis not present

## 2023-08-04 DIAGNOSIS — M25571 Pain in right ankle and joints of right foot: Secondary | ICD-10-CM | POA: Diagnosis not present

## 2023-09-13 DIAGNOSIS — Z5329 Procedure and treatment not carried out because of patient's decision for other reasons: Secondary | ICD-10-CM | POA: Diagnosis not present

## 2023-09-13 DIAGNOSIS — M5416 Radiculopathy, lumbar region: Secondary | ICD-10-CM | POA: Diagnosis not present

## 2023-09-16 DIAGNOSIS — G8929 Other chronic pain: Secondary | ICD-10-CM | POA: Diagnosis not present

## 2023-09-16 DIAGNOSIS — M79604 Pain in right leg: Secondary | ICD-10-CM | POA: Diagnosis not present

## 2023-09-16 DIAGNOSIS — F909 Attention-deficit hyperactivity disorder, unspecified type: Secondary | ICD-10-CM | POA: Diagnosis not present

## 2023-09-16 DIAGNOSIS — R4182 Altered mental status, unspecified: Secondary | ICD-10-CM | POA: Diagnosis not present

## 2023-09-16 DIAGNOSIS — Z91040 Latex allergy status: Secondary | ICD-10-CM | POA: Diagnosis not present

## 2023-09-16 DIAGNOSIS — M47816 Spondylosis without myelopathy or radiculopathy, lumbar region: Secondary | ICD-10-CM | POA: Diagnosis not present

## 2023-09-16 DIAGNOSIS — R200 Anesthesia of skin: Secondary | ICD-10-CM | POA: Diagnosis not present

## 2023-09-16 DIAGNOSIS — M51369 Other intervertebral disc degeneration, lumbar region without mention of lumbar back pain or lower extremity pain: Secondary | ICD-10-CM | POA: Diagnosis not present

## 2023-09-16 DIAGNOSIS — M545 Low back pain, unspecified: Secondary | ICD-10-CM | POA: Diagnosis not present

## 2023-09-16 DIAGNOSIS — M79605 Pain in left leg: Secondary | ICD-10-CM | POA: Diagnosis not present

## 2023-09-16 DIAGNOSIS — R569 Unspecified convulsions: Secondary | ICD-10-CM | POA: Diagnosis not present

## 2023-09-16 DIAGNOSIS — M47814 Spondylosis without myelopathy or radiculopathy, thoracic region: Secondary | ICD-10-CM | POA: Diagnosis not present

## 2023-09-16 DIAGNOSIS — R509 Fever, unspecified: Secondary | ICD-10-CM | POA: Diagnosis not present

## 2023-09-16 DIAGNOSIS — M47812 Spondylosis without myelopathy or radiculopathy, cervical region: Secondary | ICD-10-CM | POA: Diagnosis not present

## 2023-09-16 DIAGNOSIS — Z885 Allergy status to narcotic agent status: Secondary | ICD-10-CM | POA: Diagnosis not present

## 2023-09-16 DIAGNOSIS — R531 Weakness: Secondary | ICD-10-CM | POA: Diagnosis not present

## 2023-09-16 DIAGNOSIS — R1110 Vomiting, unspecified: Secondary | ICD-10-CM | POA: Diagnosis not present

## 2023-09-17 DIAGNOSIS — M47814 Spondylosis without myelopathy or radiculopathy, thoracic region: Secondary | ICD-10-CM | POA: Diagnosis not present

## 2023-09-17 DIAGNOSIS — M47816 Spondylosis without myelopathy or radiculopathy, lumbar region: Secondary | ICD-10-CM | POA: Diagnosis not present

## 2023-09-17 DIAGNOSIS — M51369 Other intervertebral disc degeneration, lumbar region without mention of lumbar back pain or lower extremity pain: Secondary | ICD-10-CM | POA: Diagnosis not present

## 2023-09-17 DIAGNOSIS — M47812 Spondylosis without myelopathy or radiculopathy, cervical region: Secondary | ICD-10-CM | POA: Diagnosis not present

## 2023-09-17 DIAGNOSIS — R569 Unspecified convulsions: Secondary | ICD-10-CM | POA: Diagnosis not present

## 2023-09-17 DIAGNOSIS — R4182 Altered mental status, unspecified: Secondary | ICD-10-CM | POA: Diagnosis not present

## 2023-09-17 DIAGNOSIS — R531 Weakness: Secondary | ICD-10-CM | POA: Diagnosis not present

## 2023-09-17 DIAGNOSIS — M545 Low back pain, unspecified: Secondary | ICD-10-CM | POA: Diagnosis not present

## 2023-09-17 DIAGNOSIS — G8929 Other chronic pain: Secondary | ICD-10-CM | POA: Diagnosis not present

## 2023-10-12 DIAGNOSIS — R7303 Prediabetes: Secondary | ICD-10-CM | POA: Diagnosis not present

## 2023-10-12 DIAGNOSIS — Z9181 History of falling: Secondary | ICD-10-CM | POA: Diagnosis not present

## 2023-10-12 DIAGNOSIS — M545 Low back pain, unspecified: Secondary | ICD-10-CM | POA: Diagnosis not present

## 2023-10-12 DIAGNOSIS — Z1331 Encounter for screening for depression: Secondary | ICD-10-CM | POA: Diagnosis not present

## 2023-10-12 DIAGNOSIS — F418 Other specified anxiety disorders: Secondary | ICD-10-CM | POA: Diagnosis not present

## 2023-10-12 DIAGNOSIS — Z1231 Encounter for screening mammogram for malignant neoplasm of breast: Secondary | ICD-10-CM | POA: Diagnosis not present

## 2023-10-12 DIAGNOSIS — K219 Gastro-esophageal reflux disease without esophagitis: Secondary | ICD-10-CM | POA: Diagnosis not present

## 2023-10-12 DIAGNOSIS — E039 Hypothyroidism, unspecified: Secondary | ICD-10-CM | POA: Diagnosis not present

## 2023-10-12 DIAGNOSIS — G5793 Unspecified mononeuropathy of bilateral lower limbs: Secondary | ICD-10-CM | POA: Diagnosis not present

## 2023-10-12 DIAGNOSIS — G2581 Restless legs syndrome: Secondary | ICD-10-CM | POA: Diagnosis not present

## 2023-10-12 DIAGNOSIS — Z79899 Other long term (current) drug therapy: Secondary | ICD-10-CM | POA: Diagnosis not present

## 2023-10-12 DIAGNOSIS — Q909 Down syndrome, unspecified: Secondary | ICD-10-CM | POA: Diagnosis not present

## 2023-10-31 DIAGNOSIS — S92511A Displaced fracture of proximal phalanx of right lesser toe(s), initial encounter for closed fracture: Secondary | ICD-10-CM | POA: Diagnosis not present

## 2023-10-31 DIAGNOSIS — S3992XA Unspecified injury of lower back, initial encounter: Secondary | ICD-10-CM | POA: Diagnosis not present

## 2023-10-31 DIAGNOSIS — X58XXXA Exposure to other specified factors, initial encounter: Secondary | ICD-10-CM | POA: Diagnosis not present

## 2023-10-31 DIAGNOSIS — M25561 Pain in right knee: Secondary | ICD-10-CM | POA: Diagnosis not present

## 2023-10-31 DIAGNOSIS — S39012A Strain of muscle, fascia and tendon of lower back, initial encounter: Secondary | ICD-10-CM | POA: Diagnosis not present

## 2023-10-31 DIAGNOSIS — M79606 Pain in leg, unspecified: Secondary | ICD-10-CM | POA: Diagnosis not present

## 2023-10-31 DIAGNOSIS — S92534A Nondisplaced fracture of distal phalanx of right lesser toe(s), initial encounter for closed fracture: Secondary | ICD-10-CM | POA: Diagnosis not present

## 2023-11-01 DIAGNOSIS — M79674 Pain in right toe(s): Secondary | ICD-10-CM | POA: Diagnosis not present

## 2023-11-01 DIAGNOSIS — I10 Essential (primary) hypertension: Secondary | ICD-10-CM | POA: Diagnosis not present

## 2023-11-01 DIAGNOSIS — M79644 Pain in right finger(s): Secondary | ICD-10-CM | POA: Diagnosis not present

## 2023-11-17 DIAGNOSIS — R1031 Right lower quadrant pain: Secondary | ICD-10-CM | POA: Diagnosis not present

## 2023-11-17 DIAGNOSIS — G8918 Other acute postprocedural pain: Secondary | ICD-10-CM | POA: Diagnosis not present

## 2023-11-17 DIAGNOSIS — N939 Abnormal uterine and vaginal bleeding, unspecified: Secondary | ICD-10-CM | POA: Diagnosis not present

## 2023-11-17 DIAGNOSIS — K8020 Calculus of gallbladder without cholecystitis without obstruction: Secondary | ICD-10-CM | POA: Diagnosis not present

## 2023-11-17 DIAGNOSIS — R109 Unspecified abdominal pain: Secondary | ICD-10-CM | POA: Diagnosis not present

## 2023-11-18 DIAGNOSIS — R1031 Right lower quadrant pain: Secondary | ICD-10-CM | POA: Diagnosis not present

## 2023-11-18 DIAGNOSIS — G8918 Other acute postprocedural pain: Secondary | ICD-10-CM | POA: Diagnosis not present

## 2023-11-18 DIAGNOSIS — R109 Unspecified abdominal pain: Secondary | ICD-10-CM | POA: Diagnosis not present

## 2023-12-04 DIAGNOSIS — F419 Anxiety disorder, unspecified: Secondary | ICD-10-CM | POA: Diagnosis not present

## 2023-12-05 DIAGNOSIS — E669 Obesity, unspecified: Secondary | ICD-10-CM | POA: Diagnosis not present

## 2023-12-05 DIAGNOSIS — R079 Chest pain, unspecified: Secondary | ICD-10-CM | POA: Diagnosis not present

## 2023-12-05 DIAGNOSIS — M791 Myalgia, unspecified site: Secondary | ICD-10-CM | POA: Diagnosis not present

## 2023-12-05 DIAGNOSIS — I517 Cardiomegaly: Secondary | ICD-10-CM | POA: Diagnosis not present

## 2023-12-05 DIAGNOSIS — M7918 Myalgia, other site: Secondary | ICD-10-CM | POA: Diagnosis not present

## 2023-12-05 DIAGNOSIS — R0602 Shortness of breath: Secondary | ICD-10-CM | POA: Diagnosis not present

## 2023-12-05 DIAGNOSIS — R600 Localized edema: Secondary | ICD-10-CM | POA: Diagnosis not present

## 2023-12-05 DIAGNOSIS — Z6841 Body Mass Index (BMI) 40.0 and over, adult: Secondary | ICD-10-CM | POA: Diagnosis not present

## 2023-12-06 DIAGNOSIS — I517 Cardiomegaly: Secondary | ICD-10-CM | POA: Diagnosis not present

## 2023-12-06 DIAGNOSIS — R079 Chest pain, unspecified: Secondary | ICD-10-CM | POA: Diagnosis not present

## 2023-12-06 DIAGNOSIS — R600 Localized edema: Secondary | ICD-10-CM | POA: Diagnosis not present

## 2023-12-06 DIAGNOSIS — R0602 Shortness of breath: Secondary | ICD-10-CM | POA: Diagnosis not present

## 2023-12-10 DIAGNOSIS — F32A Depression, unspecified: Secondary | ICD-10-CM | POA: Diagnosis not present

## 2023-12-10 DIAGNOSIS — K828 Other specified diseases of gallbladder: Secondary | ICD-10-CM | POA: Diagnosis not present

## 2023-12-10 DIAGNOSIS — R0689 Other abnormalities of breathing: Secondary | ICD-10-CM | POA: Diagnosis not present

## 2023-12-10 DIAGNOSIS — K8020 Calculus of gallbladder without cholecystitis without obstruction: Secondary | ICD-10-CM | POA: Diagnosis not present

## 2023-12-10 DIAGNOSIS — K8012 Calculus of gallbladder with acute and chronic cholecystitis without obstruction: Secondary | ICD-10-CM | POA: Diagnosis not present

## 2023-12-10 DIAGNOSIS — K8010 Calculus of gallbladder with chronic cholecystitis without obstruction: Secondary | ICD-10-CM | POA: Diagnosis not present

## 2023-12-10 DIAGNOSIS — Z6841 Body Mass Index (BMI) 40.0 and over, adult: Secondary | ICD-10-CM | POA: Diagnosis not present

## 2023-12-10 DIAGNOSIS — K660 Peritoneal adhesions (postprocedural) (postinfection): Secondary | ICD-10-CM | POA: Diagnosis not present

## 2023-12-10 DIAGNOSIS — R160 Hepatomegaly, not elsewhere classified: Secondary | ICD-10-CM | POA: Diagnosis not present

## 2023-12-10 DIAGNOSIS — R1013 Epigastric pain: Secondary | ICD-10-CM | POA: Diagnosis not present

## 2023-12-10 DIAGNOSIS — D259 Leiomyoma of uterus, unspecified: Secondary | ICD-10-CM | POA: Diagnosis not present

## 2023-12-10 DIAGNOSIS — R0902 Hypoxemia: Secondary | ICD-10-CM | POA: Diagnosis not present

## 2023-12-10 DIAGNOSIS — F419 Anxiety disorder, unspecified: Secondary | ICD-10-CM | POA: Diagnosis not present

## 2023-12-10 DIAGNOSIS — M549 Dorsalgia, unspecified: Secondary | ICD-10-CM | POA: Diagnosis not present

## 2023-12-10 DIAGNOSIS — I10 Essential (primary) hypertension: Secondary | ICD-10-CM | POA: Diagnosis not present

## 2023-12-10 DIAGNOSIS — K810 Acute cholecystitis: Secondary | ICD-10-CM | POA: Diagnosis not present

## 2023-12-10 DIAGNOSIS — D72829 Elevated white blood cell count, unspecified: Secondary | ICD-10-CM | POA: Diagnosis not present

## 2023-12-10 DIAGNOSIS — K812 Acute cholecystitis with chronic cholecystitis: Secondary | ICD-10-CM | POA: Diagnosis not present

## 2023-12-11 DIAGNOSIS — K660 Peritoneal adhesions (postprocedural) (postinfection): Secondary | ICD-10-CM | POA: Diagnosis not present

## 2023-12-11 DIAGNOSIS — K812 Acute cholecystitis with chronic cholecystitis: Secondary | ICD-10-CM | POA: Diagnosis not present

## 2023-12-11 DIAGNOSIS — K8012 Calculus of gallbladder with acute and chronic cholecystitis without obstruction: Secondary | ICD-10-CM | POA: Diagnosis not present

## 2023-12-17 DIAGNOSIS — Z9049 Acquired absence of other specified parts of digestive tract: Secondary | ICD-10-CM | POA: Diagnosis not present

## 2023-12-17 DIAGNOSIS — R109 Unspecified abdominal pain: Secondary | ICD-10-CM | POA: Diagnosis not present

## 2023-12-18 DIAGNOSIS — K7460 Unspecified cirrhosis of liver: Secondary | ICD-10-CM | POA: Diagnosis not present

## 2023-12-18 DIAGNOSIS — R1084 Generalized abdominal pain: Secondary | ICD-10-CM | POA: Diagnosis not present

## 2023-12-18 DIAGNOSIS — Z9889 Other specified postprocedural states: Secondary | ICD-10-CM | POA: Diagnosis not present

## 2023-12-18 DIAGNOSIS — Z9049 Acquired absence of other specified parts of digestive tract: Secondary | ICD-10-CM | POA: Diagnosis not present

## 2023-12-18 DIAGNOSIS — R109 Unspecified abdominal pain: Secondary | ICD-10-CM | POA: Diagnosis not present

## 2023-12-18 DIAGNOSIS — G8918 Other acute postprocedural pain: Secondary | ICD-10-CM | POA: Diagnosis not present

## 2023-12-19 DIAGNOSIS — K7460 Unspecified cirrhosis of liver: Secondary | ICD-10-CM | POA: Diagnosis not present

## 2023-12-19 DIAGNOSIS — G8918 Other acute postprocedural pain: Secondary | ICD-10-CM | POA: Diagnosis not present

## 2023-12-19 DIAGNOSIS — Z9049 Acquired absence of other specified parts of digestive tract: Secondary | ICD-10-CM | POA: Diagnosis not present

## 2023-12-19 DIAGNOSIS — Z9889 Other specified postprocedural states: Secondary | ICD-10-CM | POA: Diagnosis not present

## 2023-12-29 DIAGNOSIS — Z124 Encounter for screening for malignant neoplasm of cervix: Secondary | ICD-10-CM | POA: Diagnosis not present

## 2023-12-29 DIAGNOSIS — E0789 Other specified disorders of thyroid: Secondary | ICD-10-CM | POA: Diagnosis not present

## 2023-12-29 DIAGNOSIS — N939 Abnormal uterine and vaginal bleeding, unspecified: Secondary | ICD-10-CM | POA: Diagnosis not present

## 2023-12-29 DIAGNOSIS — N9489 Other specified conditions associated with female genital organs and menstrual cycle: Secondary | ICD-10-CM | POA: Diagnosis not present

## 2023-12-31 DIAGNOSIS — R109 Unspecified abdominal pain: Secondary | ICD-10-CM | POA: Diagnosis not present

## 2023-12-31 DIAGNOSIS — Z9049 Acquired absence of other specified parts of digestive tract: Secondary | ICD-10-CM | POA: Diagnosis not present

## 2023-12-31 DIAGNOSIS — K760 Fatty (change of) liver, not elsewhere classified: Secondary | ICD-10-CM | POA: Diagnosis not present

## 2023-12-31 DIAGNOSIS — K529 Noninfective gastroenteritis and colitis, unspecified: Secondary | ICD-10-CM | POA: Diagnosis not present

## 2023-12-31 DIAGNOSIS — R112 Nausea with vomiting, unspecified: Secondary | ICD-10-CM | POA: Diagnosis not present

## 2024-01-01 DIAGNOSIS — R112 Nausea with vomiting, unspecified: Secondary | ICD-10-CM | POA: Diagnosis not present

## 2024-01-01 DIAGNOSIS — R109 Unspecified abdominal pain: Secondary | ICD-10-CM | POA: Diagnosis not present

## 2024-01-01 DIAGNOSIS — Z9049 Acquired absence of other specified parts of digestive tract: Secondary | ICD-10-CM | POA: Diagnosis not present

## 2024-01-01 DIAGNOSIS — K760 Fatty (change of) liver, not elsewhere classified: Secondary | ICD-10-CM | POA: Diagnosis not present

## 2024-01-06 DIAGNOSIS — N390 Urinary tract infection, site not specified: Secondary | ICD-10-CM | POA: Diagnosis not present

## 2024-01-06 DIAGNOSIS — R030 Elevated blood-pressure reading, without diagnosis of hypertension: Secondary | ICD-10-CM | POA: Diagnosis not present

## 2024-01-16 DIAGNOSIS — R109 Unspecified abdominal pain: Secondary | ICD-10-CM | POA: Diagnosis not present

## 2024-01-16 DIAGNOSIS — I517 Cardiomegaly: Secondary | ICD-10-CM | POA: Diagnosis not present

## 2024-01-16 DIAGNOSIS — R1084 Generalized abdominal pain: Secondary | ICD-10-CM | POA: Diagnosis not present

## 2024-01-17 DIAGNOSIS — I517 Cardiomegaly: Secondary | ICD-10-CM | POA: Diagnosis not present

## 2024-01-17 DIAGNOSIS — R109 Unspecified abdominal pain: Secondary | ICD-10-CM | POA: Diagnosis not present

## 2024-01-24 DIAGNOSIS — R112 Nausea with vomiting, unspecified: Secondary | ICD-10-CM | POA: Diagnosis not present

## 2024-01-24 DIAGNOSIS — R935 Abnormal findings on diagnostic imaging of other abdominal regions, including retroperitoneum: Secondary | ICD-10-CM | POA: Diagnosis not present

## 2024-01-24 DIAGNOSIS — R1031 Right lower quadrant pain: Secondary | ICD-10-CM | POA: Diagnosis not present

## 2024-01-24 DIAGNOSIS — Z9049 Acquired absence of other specified parts of digestive tract: Secondary | ICD-10-CM | POA: Diagnosis not present

## 2024-01-24 DIAGNOSIS — K429 Umbilical hernia without obstruction or gangrene: Secondary | ICD-10-CM | POA: Diagnosis not present

## 2024-01-25 DIAGNOSIS — R935 Abnormal findings on diagnostic imaging of other abdominal regions, including retroperitoneum: Secondary | ICD-10-CM | POA: Diagnosis not present

## 2024-01-25 DIAGNOSIS — R1031 Right lower quadrant pain: Secondary | ICD-10-CM | POA: Diagnosis not present

## 2024-01-25 DIAGNOSIS — K429 Umbilical hernia without obstruction or gangrene: Secondary | ICD-10-CM | POA: Diagnosis not present

## 2024-03-19 DIAGNOSIS — R197 Diarrhea, unspecified: Secondary | ICD-10-CM | POA: Diagnosis not present

## 2024-03-19 DIAGNOSIS — M7989 Other specified soft tissue disorders: Secondary | ICD-10-CM | POA: Diagnosis not present

## 2024-03-19 DIAGNOSIS — M79661 Pain in right lower leg: Secondary | ICD-10-CM | POA: Diagnosis not present

## 2024-03-19 DIAGNOSIS — R1084 Generalized abdominal pain: Secondary | ICD-10-CM | POA: Diagnosis not present

## 2024-03-19 DIAGNOSIS — Z9889 Other specified postprocedural states: Secondary | ICD-10-CM | POA: Diagnosis not present

## 2024-03-19 DIAGNOSIS — R2241 Localized swelling, mass and lump, right lower limb: Secondary | ICD-10-CM | POA: Diagnosis not present

## 2024-03-20 DIAGNOSIS — M79661 Pain in right lower leg: Secondary | ICD-10-CM | POA: Diagnosis not present

## 2024-03-20 DIAGNOSIS — R2241 Localized swelling, mass and lump, right lower limb: Secondary | ICD-10-CM | POA: Diagnosis not present

## 2024-04-11 DIAGNOSIS — Z6841 Body Mass Index (BMI) 40.0 and over, adult: Secondary | ICD-10-CM | POA: Diagnosis not present

## 2024-04-11 DIAGNOSIS — R1030 Lower abdominal pain, unspecified: Secondary | ICD-10-CM | POA: Diagnosis not present

## 2024-04-11 DIAGNOSIS — Z5329 Procedure and treatment not carried out because of patient's decision for other reasons: Secondary | ICD-10-CM | POA: Diagnosis not present

## 2024-04-11 DIAGNOSIS — K219 Gastro-esophageal reflux disease without esophagitis: Secondary | ICD-10-CM | POA: Diagnosis not present

## 2024-04-11 DIAGNOSIS — R109 Unspecified abdominal pain: Secondary | ICD-10-CM | POA: Diagnosis not present

## 2024-04-11 DIAGNOSIS — Z885 Allergy status to narcotic agent status: Secondary | ICD-10-CM | POA: Diagnosis not present

## 2024-04-11 DIAGNOSIS — Z79899 Other long term (current) drug therapy: Secondary | ICD-10-CM | POA: Diagnosis not present

## 2024-04-26 DIAGNOSIS — R2241 Localized swelling, mass and lump, right lower limb: Secondary | ICD-10-CM | POA: Diagnosis not present

## 2024-04-26 DIAGNOSIS — M79604 Pain in right leg: Secondary | ICD-10-CM | POA: Diagnosis not present

## 2024-04-26 DIAGNOSIS — M25461 Effusion, right knee: Secondary | ICD-10-CM | POA: Diagnosis not present

## 2024-04-26 DIAGNOSIS — M79661 Pain in right lower leg: Secondary | ICD-10-CM | POA: Diagnosis not present

## 2024-05-03 DIAGNOSIS — M79604 Pain in right leg: Secondary | ICD-10-CM | POA: Diagnosis not present

## 2024-05-03 DIAGNOSIS — R600 Localized edema: Secondary | ICD-10-CM | POA: Diagnosis not present

## 2024-05-03 DIAGNOSIS — E669 Obesity, unspecified: Secondary | ICD-10-CM | POA: Diagnosis not present
